# Patient Record
Sex: Male | Born: 1975 | Race: Black or African American | Hispanic: No | Smoking: Current every day smoker
Health system: Southern US, Community
[De-identification: ages and names within clinical notes are randomized; demographics above are authoritative.]

## PROBLEM LIST (undated history)

## (undated) DIAGNOSIS — I499 Cardiac arrhythmia, unspecified: Secondary | ICD-10-CM

## (undated) DIAGNOSIS — R0602 Shortness of breath: Secondary | ICD-10-CM

## (undated) DIAGNOSIS — Z21 Asymptomatic human immunodeficiency virus [HIV] infection status: Secondary | ICD-10-CM

## (undated) DIAGNOSIS — B20 Human immunodeficiency virus [HIV] disease: Secondary | ICD-10-CM

## (undated) DIAGNOSIS — I10 Essential (primary) hypertension: Secondary | ICD-10-CM

## (undated) DIAGNOSIS — K219 Gastro-esophageal reflux disease without esophagitis: Secondary | ICD-10-CM

## (undated) HISTORY — PX: HEMORRHOID SURGERY: SHX153

---

## 2001-11-20 ENCOUNTER — Emergency Department (HOSPITAL_COMMUNITY): Admission: EM | Admit: 2001-11-20 | Discharge: 2001-11-21 | Payer: Self-pay

## 2001-11-21 ENCOUNTER — Encounter: Payer: Self-pay | Admitting: Emergency Medicine

## 2002-03-09 ENCOUNTER — Encounter: Payer: Self-pay | Admitting: Emergency Medicine

## 2002-03-09 ENCOUNTER — Emergency Department (HOSPITAL_COMMUNITY): Admission: EM | Admit: 2002-03-09 | Discharge: 2002-03-09 | Payer: Self-pay | Admitting: Emergency Medicine

## 2002-03-10 ENCOUNTER — Emergency Department (HOSPITAL_COMMUNITY): Admission: EM | Admit: 2002-03-10 | Discharge: 2002-03-10 | Payer: Self-pay | Admitting: Emergency Medicine

## 2002-03-11 ENCOUNTER — Encounter: Payer: Self-pay | Admitting: Emergency Medicine

## 2002-03-11 ENCOUNTER — Emergency Department (HOSPITAL_COMMUNITY): Admission: EM | Admit: 2002-03-11 | Discharge: 2002-03-11 | Payer: Self-pay | Admitting: Emergency Medicine

## 2002-03-13 ENCOUNTER — Encounter: Admission: RE | Admit: 2002-03-13 | Discharge: 2002-03-13 | Payer: Self-pay | Admitting: Internal Medicine

## 2002-04-12 ENCOUNTER — Encounter (HOSPITAL_BASED_OUTPATIENT_CLINIC_OR_DEPARTMENT_OTHER): Admission: RE | Admit: 2002-04-12 | Discharge: 2002-07-11 | Payer: Self-pay | Admitting: Internal Medicine

## 2002-06-18 ENCOUNTER — Emergency Department (HOSPITAL_COMMUNITY): Admission: EM | Admit: 2002-06-18 | Discharge: 2002-06-19 | Payer: Self-pay | Admitting: Emergency Medicine

## 2002-06-19 ENCOUNTER — Encounter: Payer: Self-pay | Admitting: Emergency Medicine

## 2003-09-19 ENCOUNTER — Emergency Department (HOSPITAL_COMMUNITY): Admission: AD | Admit: 2003-09-19 | Discharge: 2003-09-19 | Payer: Self-pay | Admitting: Family Medicine

## 2007-05-06 ENCOUNTER — Emergency Department (HOSPITAL_COMMUNITY): Admission: EM | Admit: 2007-05-06 | Discharge: 2007-05-06 | Payer: Self-pay | Admitting: Emergency Medicine

## 2007-12-17 ENCOUNTER — Emergency Department (HOSPITAL_COMMUNITY): Admission: EM | Admit: 2007-12-17 | Discharge: 2007-12-17 | Payer: Self-pay | Admitting: Emergency Medicine

## 2008-09-23 ENCOUNTER — Emergency Department (HOSPITAL_COMMUNITY): Admission: EM | Admit: 2008-09-23 | Discharge: 2008-09-24 | Payer: Self-pay | Admitting: Emergency Medicine

## 2009-05-10 ENCOUNTER — Emergency Department (HOSPITAL_COMMUNITY): Admission: EM | Admit: 2009-05-10 | Discharge: 2009-05-10 | Payer: Self-pay | Admitting: Emergency Medicine

## 2009-08-31 ENCOUNTER — Emergency Department (HOSPITAL_COMMUNITY): Admission: EM | Admit: 2009-08-31 | Discharge: 2009-08-31 | Payer: Self-pay | Admitting: Family Medicine

## 2009-09-12 ENCOUNTER — Emergency Department (HOSPITAL_COMMUNITY): Admission: EM | Admit: 2009-09-12 | Discharge: 2009-09-12 | Payer: Self-pay | Admitting: Emergency Medicine

## 2009-11-08 ENCOUNTER — Emergency Department (HOSPITAL_COMMUNITY): Admission: EM | Admit: 2009-11-08 | Discharge: 2009-11-08 | Payer: Self-pay | Admitting: Emergency Medicine

## 2010-05-23 ENCOUNTER — Emergency Department (HOSPITAL_COMMUNITY): Admission: EM | Admit: 2010-05-23 | Discharge: 2010-05-23 | Payer: Self-pay | Admitting: Emergency Medicine

## 2010-12-01 ENCOUNTER — Emergency Department (HOSPITAL_COMMUNITY): Payer: Self-pay

## 2010-12-01 ENCOUNTER — Observation Stay (HOSPITAL_COMMUNITY)
Admission: EM | Admit: 2010-12-01 | Discharge: 2010-12-02 | Disposition: A | Discharge status: Home / Self Care | Payer: Self-pay | Attending: Family Medicine | Admitting: Family Medicine

## 2010-12-01 DIAGNOSIS — I10 Essential (primary) hypertension: Secondary | ICD-10-CM | POA: Insufficient documentation

## 2010-12-01 DIAGNOSIS — R0602 Shortness of breath: Secondary | ICD-10-CM | POA: Insufficient documentation

## 2010-12-01 DIAGNOSIS — R0789 Other chest pain: Secondary | ICD-10-CM

## 2010-12-01 DIAGNOSIS — Z21 Asymptomatic human immunodeficiency virus [HIV] infection status: Secondary | ICD-10-CM | POA: Insufficient documentation

## 2010-12-01 DIAGNOSIS — I498 Other specified cardiac arrhythmias: Secondary | ICD-10-CM | POA: Insufficient documentation

## 2010-12-01 DIAGNOSIS — F1410 Cocaine abuse, uncomplicated: Secondary | ICD-10-CM | POA: Insufficient documentation

## 2010-12-01 DIAGNOSIS — F1210 Cannabis abuse, uncomplicated: Secondary | ICD-10-CM | POA: Insufficient documentation

## 2010-12-01 DIAGNOSIS — Z79899 Other long term (current) drug therapy: Secondary | ICD-10-CM | POA: Insufficient documentation

## 2010-12-01 DIAGNOSIS — B20 Human immunodeficiency virus [HIV] disease: Secondary | ICD-10-CM

## 2010-12-01 DIAGNOSIS — R079 Chest pain, unspecified: Principal | ICD-10-CM | POA: Insufficient documentation

## 2010-12-01 LAB — POCT I-STAT, CHEM 8
BUN: 9 mg/dL (ref 6–23)
Creatinine, Ser: 1.2 mg/dL (ref 0.4–1.5)
Glucose, Bld: 92 mg/dL (ref 70–99)
Potassium: 3.4 mEq/L — ABNORMAL LOW (ref 3.5–5.1)
Sodium: 141 mEq/L (ref 135–145)
TCO2: 23 mmol/L (ref 0–100)

## 2010-12-01 LAB — RAPID URINE DRUG SCREEN, HOSP PERFORMED
Amphetamines: NOT DETECTED
Cocaine: POSITIVE — AB
Opiates: NOT DETECTED
Tetrahydrocannabinol: POSITIVE — AB

## 2010-12-01 LAB — POCT CARDIAC MARKERS

## 2010-12-02 LAB — CARDIAC PANEL(CRET KIN+CKTOT+MB+TROPI)
CK, MB: 1.5 ng/mL (ref 0.3–4.0)
CK, MB: 1 ng/mL (ref 0.3–4.0)
Relative Index: 0.7 (ref 0.0–2.5)
Relative Index: 0.6 (ref 0.0–2.5)
Total CK: 175 U/L (ref 7–232)
Troponin I: 0.01 ng/mL (ref 0.00–0.06)

## 2010-12-02 LAB — COMPREHENSIVE METABOLIC PANEL
ALT: 14 U/L (ref 0–53)
AST: 17 U/L (ref 0–37)
CO2: 29 mEq/L (ref 19–32)
Chloride: 104 mEq/L (ref 96–112)
GFR calc Af Amer: >60 mL/min (ref >60)
GFR calc non Af Amer: >60 mL/min (ref >60)
Potassium: 3.1 mEq/L — ABNORMAL LOW (ref 3.5–5.1)
Sodium: 140 mEq/L (ref 135–145)
Total Bilirubin: 0.3 mg/dL (ref 0.3–1.2)

## 2010-12-02 LAB — T-HELPER CELLS (CD4) COUNT (NOT AT ARMC): CD4 % Helper T Cell: 37 % (ref 33–55)

## 2010-12-02 LAB — LIPID PANEL: HDL: 41 mg/dL (ref >39)

## 2010-12-02 LAB — CBC
Hemoglobin: 13.8 g/dL (ref 13.0–17.0)
RBC: 4.41 MIL/uL (ref 4.22–5.81)

## 2010-12-02 LAB — TSH: TSH: 2.042 u[IU]/mL (ref 0.350–4.500)

## 2010-12-05 LAB — HIV 1/2 CONFIRMATION: HIV-1 antibody: POSITIVE

## 2010-12-15 NOTE — Discharge Summary (Signed)
NAME:  Jerry Scott, Jerry Scott               ACCOUNT NO.:  [id number]  MEDICAL RECORD NO.:  [id number]           PATIENT TYPE:  E  LOCATION:  [facility]                         FACILITY:  [facility]  PHYSICIAN:  Leighton Roach Rue Tinnel, M.D.DATE OF BIRTH:  19-Sep-1975  DATE OF ADMISSION:  12/01/2010 DATE OF DISCHARGE:  12/02/2010                              DISCHARGE SUMMARY   DISCHARGE DIAGNOSES: 1. Chest pain in the setting of supraventricular tachycardia,     resolved. 2. Hypertension. 3. Human immunodeficiency virus.  DISCHARGE MEDICATIONS:  New medications at discharge: 1. Tylenol 650 mg p.o. q.6 h. as needed for pain. 2. Hydrochlorothiazide 25 mg p.o. daily. 3. Atripla 1 tablet p.o. daily.  PERTINENT LABS AT DISCHARGE:  Cardiac panel negative x3 sets.  CD-4 count 1120, helper T cell percent 37, A1c 5.2, TSH 2.042, cholesterol 171, HDL 41, LDL 106.  PERTINENT STUDIES AT DISCHARGE:  With EKG, normal sinus rhythm to the rate.  Heart rate is 62.  HOSPITAL COURSE:  The patient is a 35 year old male who presented with the chief complaint of chest pain, with supraventricular tachycardia with the heart rate of 167, QRS duration of 98. 1. Chest pain.  The patient is an SVT spontaneously converted to sinus     rhythm.  While in the ED, he was admitted for chest pain rule out.     The patient chest pain resolved overnight.     Cardiac enzymes were cycled and were negative as mentioned above.     A repeat EKG obtained in a.m. is as mentioned above with normal     sinus rhythm at the rate of 62.  The etiology of the patient's chest     pain was determined most likely to be secondary to his cocaine     abuse and SVT.  Other labs for risk stratification were as     mentioned above. The patient was counseled to refrain from cocaine     abuse in order to prevent repeat chest pain. 2. Hypertension.  The patient has a known history of hypertension with     diastolic  blood pressures in the 90s on previous ED  visit.  He takes     hydrochlorothiazide but has been out for the past 2 months.     Hydrochlorothiazide was restarted, on this admission.  The patient     was given prescription for a 54-month supply. 3. History of HIV.  The patient continued on his home dose of Atripla.     The viral load was as mentioned above.  The patient has followed up     with his Infectious Disease physician at [facility], did miss his     last 2 appointments.  He is advised to schedule an appointment at     discharge.  DISCHARGE INSTRUCTIONS:  The patient was discharged to home with instruction with no limitations on diet or activity.  He was instructed to follow up with his primary MD at [facility] in next month for hospital followup.  He was also instructed to refrain from cocaine use.    ______________________________ Dessa Phi, MD  ______________________________ Etta Grandchild, M.D.    JF/MEDQ  D:  12/03/2010  T:  12/04/2010  Job:  [id number]  Electronically Signed by Dessa Phi MD on 12/13/2010 03:35:31 PM Electronically Signed by Acquanetta Belling M.D. on 12/15/2010 09:37:36 AM

## 2010-12-15 NOTE — H&P (Signed)
NAME:  Jerry Scott, Jerry Scott NO.:  [id number]  MEDICAL RECORD NO.:  [id number]           PATIENT TYPE:  E  LOCATION:  [facility]                         FACILITY:  [facility]  PHYSICIAN:  Jerry Scott, M.D.DATE OF BIRTH:  1975/11/04  DATE OF ADMISSION:  12/01/2010 DATE OF DISCHARGE:                             HISTORY & PHYSICAL   PRIMARY CARE PHYSICIAN:  Unassigned followed by Infectious Disease at [facility].  CHIEF COMPLAINT:  Palpitations and chest pain.  HISTORY OF PRESENT ILLNESS:  Jerry Scott is a 35 year old male with past medical history significant for hypertension, HIV and cocaine and cannabis abuse who presents with 1 day history of palpitations and chest pain.  The patient states he is using cocaine last night and was started sneezing.  Chest pain started after sneezing.  He smokes marijuana and start experiencing a "fluttering in his chest."  This lasted for about an hour and then resolved.  He works today at [employer] job, but did not experience any further symptoms.  When he got home from works, he smokes at this time and experienced the same symptoms.  With this time, also complaint of shortness of breath and some chest pain.  He described the chest pain was sternal, sharp pain and dull aching pain.  He came to the emergency department tonight because of these symptoms.  Of note, he denies any headaches, abdominal pain, nausea, vomiting, change in bowel habits, or any lower extremity edema.  In the emergency department, the chest pain and shortness of breath which he experienced earlier today have now resolved.  PAST MEDICAL HISTORY: 1. Hypertension. 2. HIV.  MEDICATIONS:  Atripla.  ALLERGIES:  No known drug allergies.  FAMILY HISTORY:  Hypertension, diabetes mellitus type 2.  Otherwise he denies.  SOCIAL HISTORY:  The patient lives at home here in [place] with his friend.  He denies any tobacco use.  Does endorse an occasional  ethanol marijuana, and cocaine use.  REVIEW OF SYSTEMS:  Twelve-point review of systems is clearly negative except for HPI.  PHYSICAL EXAMINATION:  VITAL SIGNS:  Temperature 98, pulse 88, respiratory rate 18, blood pressure was 122/88, O2 sats 98% on room air. GENERAL:  The patient is lying in bed and comfortable appearing. HEENT:  Head:  Normocephalic and atraumatic.  Normal conjunctivae. Extraocular movements intact.  Pupils are equal, round, and reactive to light.  Mucous membranes moist. NECK:  No JVD.  No thyromegaly. CV:  Regular rate and rhythm without murmurs, rubs, or gallops. LUNGS:  Respirations clear to auscultation bilaterally.  No crackle or coarse breath sounds throughout. ABDOMEN:  Soft, nondistended, and nontender.  Bowel sounds x4 quadrants. EXTREMITIES:  No erythema, clubbing or cyanosis.  No edema.  Pulses; 2+ DP pulses bilaterally with brisk capillary refills in toes bilaterally. NEUROLOGIC:  Cranial nerves II through XII intact.  No sensorimotor deficits in bilateral upper or lower extremities.  LABORATORY DATA: 1. Hemoglobin 15, hematocrit 44. 2. BMET showed sodium 141, potassium 3.4, chloride 92, bicarb 23, BUN     9, creatinine 1.2. 3. UDS positive for THC and cocaine. 4. Point-of-care cardiac enzymes are  negative.  IMAGING:  Chest x-ray showed no acute cardiopulmonary disease.  ASSESSMENT AND PLAN:  A 35 year old male with past medical history significant for hypertension and HIV who was admitted for palpitations and chest pain with rule out acute coronary syndrome. 1. Palpitations.  Tele here in the emergency department did show     supraventricular tachycardia to 180s, but this was spontaneously     resolved without medication.  The patient returned from chest x-ray     before medications could be given.  In this patient with recent cocaine abuse,     there was concern for possible acute coronary syndrome.  We will     cycle cardiac enzymes.  Chest  x-ray in the ED showed no lung     disease or fluid overload.  We will obtain stat EKG and obtain     another EKG in the morning to clear.  No EKG was performed in the     emergency department.  Also, obtain TSH to rule out thyroid     disease.  Obtain a fasting lipid panel with A1c addressed by the     patient, although his cocaine already puts him in the high-risk     category.  We admits to telemetry, follow closely. 2. Hypertension, he is on his medications currently.  We will watch     his blood pressures while in-house.  We will hold his beta-blockers     and the patient with recent cocaine use.  Will use hydralazine     p.r.n. if  needed. 3. Human immunodeficiency virus.  The patient states that he usually     follows up with [facility], but he has missed several     appointments including his most recent one.  CBC does not show any     evidence of  immediate conversation with normal white blood cell     count.  Plan to obtain a viral load and C4 count.  We will continue     the patient on Atripla. 4. GI.  Heart-healthy diet.  Check electrolytes in a.m. 5. Prophylaxis.  Heparin 5000 units subsequently. 6. Disposition.  Pending clinical improvement.     Jerry Don, MD   ______________________________ Jerry Scott, M.D.    JW/MEDQ  D:  12/02/2010  T:  12/02/2010  Job:  [id number]  Electronically Signed by Jerry Scott  on 12/09/2010 01:54:36 PM Electronically Signed by Jerry Scott M.D. on 12/15/2010 09:37:30 AM

## 2011-02-16 ENCOUNTER — Inpatient Hospital Stay (INDEPENDENT_AMBULATORY_CARE_PROVIDER_SITE_OTHER)
Admission: RE | Admit: 2011-02-16 | Discharge: 2011-02-16 | Disposition: A | Discharge status: Home / Self Care | Payer: Self-pay | Source: Ambulatory Visit | Attending: Emergency Medicine | Admitting: Emergency Medicine

## 2011-02-16 DIAGNOSIS — J019 Acute sinusitis, unspecified: Secondary | ICD-10-CM

## 2011-02-16 DIAGNOSIS — G510 Bell's palsy: Secondary | ICD-10-CM

## 2011-06-12 LAB — COMPREHENSIVE METABOLIC PANEL
Albumin: 4
BUN: 5 — ABNORMAL LOW
Calcium: 9.5
Creatinine, Ser: 0.9
Potassium: 3.5
Total Protein: 7.1

## 2011-06-12 LAB — CBC
HCT: 43.5
MCHC: 34.9
MCV: 92.8
Platelets: 243
RDW: 12.5

## 2011-06-12 LAB — DIFFERENTIAL
Lymphocytes Relative: 35
Lymphs Abs: 1.9
Monocytes Absolute: 0.5
Monocytes Relative: 9
Neutro Abs: 2.8

## 2011-06-17 ENCOUNTER — Other Ambulatory Visit: Payer: Self-pay | Admitting: Specialist

## 2011-06-17 ENCOUNTER — Other Ambulatory Visit: Payer: Self-pay

## 2011-06-17 DIAGNOSIS — M549 Dorsalgia, unspecified: Secondary | ICD-10-CM

## 2011-06-18 ENCOUNTER — Other Ambulatory Visit (HOSPITAL_COMMUNITY): Payer: Self-pay | Admitting: Specialist

## 2011-06-18 DIAGNOSIS — M549 Dorsalgia, unspecified: Secondary | ICD-10-CM

## 2011-06-19 ENCOUNTER — Ambulatory Visit (HOSPITAL_COMMUNITY)
Admission: RE | Admit: 2011-06-19 | Discharge: 2011-06-19 | Disposition: A | Discharge status: Home / Self Care | Payer: Self-pay | Source: Ambulatory Visit | Attending: Specialist | Admitting: Specialist

## 2011-06-19 DIAGNOSIS — M51379 Other intervertebral disc degeneration, lumbosacral region without mention of lumbar back pain or lower extremity pain: Secondary | ICD-10-CM | POA: Insufficient documentation

## 2011-06-19 DIAGNOSIS — M549 Dorsalgia, unspecified: Secondary | ICD-10-CM

## 2011-06-19 DIAGNOSIS — M5126 Other intervertebral disc displacement, lumbar region: Secondary | ICD-10-CM | POA: Insufficient documentation

## 2011-06-19 DIAGNOSIS — M4120 Other idiopathic scoliosis, site unspecified: Secondary | ICD-10-CM | POA: Insufficient documentation

## 2011-06-19 DIAGNOSIS — M5137 Other intervertebral disc degeneration, lumbosacral region: Secondary | ICD-10-CM | POA: Insufficient documentation

## 2011-06-25 ENCOUNTER — Other Ambulatory Visit: Payer: Self-pay | Admitting: Specialist

## 2011-06-25 DIAGNOSIS — M549 Dorsalgia, unspecified: Secondary | ICD-10-CM

## 2011-06-29 ENCOUNTER — Inpatient Hospital Stay: Admission: RE | Admit: 2011-06-29 | Payer: Self-pay | Source: Ambulatory Visit

## 2011-07-01 ENCOUNTER — Inpatient Hospital Stay
Admission: RE | Admit: 2011-07-01 | Discharge: 2011-07-01 | Payer: Self-pay | Source: Ambulatory Visit | Attending: Specialist | Admitting: Specialist

## 2011-07-01 DIAGNOSIS — M5126 Other intervertebral disc displacement, lumbar region: Secondary | ICD-10-CM

## 2011-07-16 ENCOUNTER — Inpatient Hospital Stay: Admission: RE | Admit: 2011-07-16 | Payer: Self-pay | Source: Ambulatory Visit

## 2011-08-20 ENCOUNTER — Other Ambulatory Visit (HOSPITAL_COMMUNITY): Payer: Self-pay | Admitting: Specialist

## 2011-08-27 ENCOUNTER — Encounter (HOSPITAL_COMMUNITY): Payer: Self-pay | Admitting: Pharmacy Technician

## 2011-08-31 ENCOUNTER — Encounter (HOSPITAL_COMMUNITY)
Admission: RE | Admit: 2011-08-31 | Discharge: 2011-08-31 | Disposition: A | Discharge status: Home / Self Care | Payer: PRIVATE HEALTH INSURANCE | Source: Ambulatory Visit | Attending: Specialist | Admitting: Specialist

## 2011-08-31 ENCOUNTER — Encounter (HOSPITAL_COMMUNITY): Payer: Self-pay

## 2011-08-31 ENCOUNTER — Other Ambulatory Visit: Payer: Self-pay

## 2011-08-31 HISTORY — DX: Gastro-esophageal reflux disease without esophagitis: K21.9

## 2011-08-31 HISTORY — DX: Shortness of breath: R06.02

## 2011-08-31 HISTORY — DX: Cardiac arrhythmia, unspecified: I49.9

## 2011-08-31 HISTORY — DX: Essential (primary) hypertension: I10

## 2011-08-31 HISTORY — DX: Human immunodeficiency virus (HIV) disease: B20

## 2011-08-31 HISTORY — DX: Asymptomatic human immunodeficiency virus (hiv) infection status: Z21

## 2011-08-31 LAB — CBC
Hemoglobin: 14.8 g/dL (ref 13.0–17.0)
MCH: 32.7 pg (ref 26.0–34.0)
MCHC: 35.6 g/dL (ref 30.0–36.0)
MCV: 91.8 fL (ref 78.0–100.0)
Platelets: 179 10*3/uL (ref 150–400)
RBC: 4.53 MIL/uL (ref 4.22–5.81)

## 2011-08-31 LAB — SURGICAL PCR SCREEN: MRSA, PCR: NEGATIVE

## 2011-08-31 LAB — BASIC METABOLIC PANEL
BUN: 10 mg/dL (ref 6–23)
CO2: 25 mEq/L (ref 19–32)
Calcium: 9.5 mg/dL (ref 8.4–10.5)
GFR calc non Af Amer: >90 mL/min (ref >90)
Glucose, Bld: 100 mg/dL — ABNORMAL HIGH (ref 70–99)
Sodium: 142 mEq/L (ref 135–145)

## 2011-08-31 NOTE — Progress Notes (Signed)
Echo requested from [facility].

## 2011-08-31 NOTE — Progress Notes (Signed)
Forwarded EKG and echo to anesthesia for review.

## 2011-08-31 NOTE — Pre-Procedure Instructions (Signed)
20 Jerry Scott  08/31/2011   Your procedure is scheduled on:  Friday September 04, 2011*  Report to [facility] at 1030 AM.  Call this number if you have problems the morning of surgery: [phone]   Remember:   Do not eat food:After Midnight.  May have clear liquids: up to 4 Hours before arrival. (up to 6:30am)  Clear liquids include soda, tea, black coffee, apple or grape juice, broth.  Take these medicines the morning of surgery with A SIP OF WATER: Atripla, hydrocodone   Do not wear jewelry, make-up or nail polish.  Do not wear lotions, powders, or perfumes. You may wear deodorant.  Do not shave 48 hours prior to surgery.  Do not bring valuables to the hospital.  Contacts, dentures or bridgework may not be worn into surgery.  Leave suitcase in the car. After surgery it may be brought to your room.  For patients admitted to the hospital, checkout time is 11:00 AM the day of discharge.   Patients discharged the day of surgery will not be allowed to drive home.  Name and phone number of your driver: Jerry Scott [phone]  Special Instructions: CHG Shower Use Special Wash: 1/2 bottle night before surgery and 1/2 bottle morning of surgery.   Please read over the following fact sheets that you were given: Pain Booklet, Coughing and Deep Breathing, MRSA Information and Surgical Site Infection Prevention

## 2011-09-02 NOTE — Consult Note (Signed)
Anesthesia:  Patient is a 36 year old male scheduled for left L5-S1 microdiskectomy on 09/04/11.  His history includes HIV, GERD, HTN, and smoking.    His EKG shows a low R or Q wave in III, but otherwise appears normal.  The interpreting Cardiologist also felt EKG was NSR.  He had an echo done at [facility] on 09/12/10 that showed normal LV function, no significant valvular abnormalities, and EF of 60%.  CXR from 12/01/10 showed no acute process.  Preoperative labs also unremarkable.  Plan to proceed.

## 2011-09-03 MED ORDER — CEFAZOLIN SODIUM-DEXTROSE 2-3 GM-% IV SOLR
2.0000 g | INTRAVENOUS | Status: AC
  Administered 2011-09-04: 2 g via INTRAVENOUS
  Filled 2011-09-03: qty 50

## 2011-09-04 ENCOUNTER — Encounter (HOSPITAL_COMMUNITY): Payer: Self-pay | Admitting: Vascular Surgery

## 2011-09-04 ENCOUNTER — Ambulatory Visit (HOSPITAL_COMMUNITY): Payer: PRIVATE HEALTH INSURANCE

## 2011-09-04 ENCOUNTER — Encounter (HOSPITAL_COMMUNITY)
Admission: RE | Disposition: A | Discharge status: Home / Self Care | Payer: Self-pay | Source: Ambulatory Visit | Attending: Specialist

## 2011-09-04 ENCOUNTER — Encounter (HOSPITAL_COMMUNITY): Payer: Self-pay | Admitting: *Deleted

## 2011-09-04 ENCOUNTER — Encounter (HOSPITAL_COMMUNITY): Payer: Self-pay | Admitting: Specialist

## 2011-09-04 ENCOUNTER — Ambulatory Visit (HOSPITAL_COMMUNITY): Payer: PRIVATE HEALTH INSURANCE | Admitting: Vascular Surgery

## 2011-09-04 ENCOUNTER — Ambulatory Visit (HOSPITAL_COMMUNITY)
Admission: RE | Admit: 2011-09-04 | Discharge: 2011-09-05 | Disposition: A | Discharge status: Home / Self Care | Payer: PRIVATE HEALTH INSURANCE | Source: Ambulatory Visit | Attending: Specialist | Admitting: Specialist

## 2011-09-04 DIAGNOSIS — K219 Gastro-esophageal reflux disease without esophagitis: Secondary | ICD-10-CM | POA: Insufficient documentation

## 2011-09-04 DIAGNOSIS — F17200 Nicotine dependence, unspecified, uncomplicated: Secondary | ICD-10-CM | POA: Insufficient documentation

## 2011-09-04 DIAGNOSIS — I499 Cardiac arrhythmia, unspecified: Secondary | ICD-10-CM | POA: Insufficient documentation

## 2011-09-04 DIAGNOSIS — R0602 Shortness of breath: Secondary | ICD-10-CM | POA: Insufficient documentation

## 2011-09-04 DIAGNOSIS — M5126 Other intervertebral disc displacement, lumbar region: Secondary | ICD-10-CM | POA: Diagnosis present

## 2011-09-04 DIAGNOSIS — F411 Generalized anxiety disorder: Secondary | ICD-10-CM | POA: Insufficient documentation

## 2011-09-04 DIAGNOSIS — M48062 Spinal stenosis, lumbar region with neurogenic claudication: Secondary | ICD-10-CM | POA: Diagnosis present

## 2011-09-04 DIAGNOSIS — F329 Major depressive disorder, single episode, unspecified: Secondary | ICD-10-CM | POA: Insufficient documentation

## 2011-09-04 DIAGNOSIS — I10 Essential (primary) hypertension: Secondary | ICD-10-CM | POA: Insufficient documentation

## 2011-09-04 DIAGNOSIS — F3289 Other specified depressive episodes: Secondary | ICD-10-CM | POA: Insufficient documentation

## 2011-09-04 DIAGNOSIS — Z21 Asymptomatic human immunodeficiency virus [HIV] infection status: Secondary | ICD-10-CM | POA: Insufficient documentation

## 2011-09-04 HISTORY — PX: LUMBAR LAMINECTOMY: SHX95

## 2011-09-04 SURGERY — MICRODISCECTOMY LUMBAR LAMINECTOMY
Anesthesia: General

## 2011-09-04 MED ORDER — HYDROMORPHONE HCL PF 1 MG/ML IJ SOLN
0.2500 mg | INTRAMUSCULAR | Status: DC | PRN
  Administered 2011-09-04 (×2): 0.5 mg via INTRAVENOUS

## 2011-09-04 MED ORDER — OXYCODONE-ACETAMINOPHEN 5-325 MG PO TABS
1.0000 | ORAL_TABLET | ORAL | Status: DC | PRN
  Administered 2011-09-05 (×2): 2 via ORAL
  Filled 2011-09-04 (×2): qty 2

## 2011-09-04 MED ORDER — BUPIVACAINE-EPINEPHRINE 0.5% -1:200000 IJ SOLN
INTRAMUSCULAR | Status: DC | PRN
  Administered 2011-09-04: 20 mL

## 2011-09-04 MED ORDER — KETOROLAC TROMETHAMINE 30 MG/ML IJ SOLN
30.0000 mg | Freq: Four times a day (QID) | INTRAMUSCULAR | Status: DC
  Administered 2011-09-04 – 2011-09-05 (×2): 30 mg via INTRAVENOUS
  Filled 2011-09-04 (×6): qty 1

## 2011-09-04 MED ORDER — MEPERIDINE HCL 25 MG/ML IJ SOLN: 6.2500 mg | INTRAMUSCULAR | Status: DC | PRN

## 2011-09-04 MED ORDER — MIDAZOLAM HCL 2 MG/2ML IJ SOLN: 0.5000 mg | Freq: Once | INTRAMUSCULAR | Status: DC | PRN

## 2011-09-04 MED ORDER — EFAVIRENZ-EMTRICITAB-TENOFOVIR 600-200-300 MG PO TABS
1.0000 | ORAL_TABLET | Freq: Every day | ORAL | Status: DC
  Administered 2011-09-04: 1 via ORAL
  Filled 2011-09-04 (×2): qty 1

## 2011-09-04 MED ORDER — HYDROMORPHONE HCL PF 1 MG/ML IJ SOLN
0.5000 mg | INTRAMUSCULAR | Status: DC | PRN
  Administered 2011-09-04 – 2011-09-05 (×2): 1 mg via INTRAVENOUS
  Filled 2011-09-04 (×3): qty 1

## 2011-09-04 MED ORDER — LACTATED RINGERS IV SOLN
INTRAVENOUS | Status: DC
  Administered 2011-09-04: 12:00:00 via INTRAVENOUS

## 2011-09-04 MED ORDER — PROMETHAZINE HCL 25 MG/ML IJ SOLN: 6.2500 mg | INTRAMUSCULAR | Status: DC | PRN

## 2011-09-04 MED ORDER — BISACODYL 10 MG RE SUPP: 10.0000 mg | Freq: Every day | RECTAL | Status: DC | PRN

## 2011-09-04 MED ORDER — VECURONIUM BROMIDE 10 MG IV SOLR
INTRAVENOUS | Status: DC | PRN
  Administered 2011-09-04: 10 mg via INTRAVENOUS

## 2011-09-04 MED ORDER — PNEUMOCOCCAL VAC POLYVALENT 25 MCG/0.5ML IJ INJ
0.5000 mL | INJECTION | INTRAMUSCULAR | Status: DC
  Filled 2011-09-04: qty 0.5

## 2011-09-04 MED ORDER — ONDANSETRON HCL 4 MG/2ML IJ SOLN
4.0000 mg | INTRAMUSCULAR | Status: DC | PRN
  Administered 2011-09-04 – 2011-09-05 (×2): 4 mg via INTRAVENOUS
  Filled 2011-09-04 (×2): qty 2

## 2011-09-04 MED ORDER — METHOCARBAMOL 500 MG PO TABS
500.0000 mg | ORAL_TABLET | Freq: Four times a day (QID) | ORAL | Status: DC | PRN
  Administered 2011-09-04 – 2011-09-05 (×2): 500 mg via ORAL
  Filled 2011-09-04 (×2): qty 1

## 2011-09-04 MED ORDER — LACTATED RINGERS IV SOLN
INTRAVENOUS | Status: DC | PRN
  Administered 2011-09-04 (×2): via INTRAVENOUS

## 2011-09-04 MED ORDER — ZOLPIDEM TARTRATE 10 MG PO TABS: 10.0000 mg | ORAL_TABLET | Freq: Every evening | ORAL | Status: DC | PRN

## 2011-09-04 MED ORDER — PHENOL 1.4 % MT LIQD
1.0000 | OROMUCOSAL | Status: DC | PRN
  Filled 2011-09-04: qty 177

## 2011-09-04 MED ORDER — LACTATED RINGERS IV SOLN: INTRAVENOUS | Status: DC

## 2011-09-04 MED ORDER — HYDROCHLOROTHIAZIDE 25 MG PO TABS
25.0000 mg | ORAL_TABLET | Freq: Every day | ORAL | Status: DC
  Administered 2011-09-04: 25 mg via ORAL
  Filled 2011-09-04 (×2): qty 1

## 2011-09-04 MED ORDER — CEFAZOLIN SODIUM 1-5 GM-% IV SOLN
1.0000 g | Freq: Three times a day (TID) | INTRAVENOUS | Status: AC
  Administered 2011-09-04 – 2011-09-05 (×2): 1 g via INTRAVENOUS
  Filled 2011-09-04 (×2): qty 50

## 2011-09-04 MED ORDER — ALUM & MAG HYDROXIDE-SIMETH 200-200-20 MG/5ML PO SUSP: 30.0000 mL | Freq: Four times a day (QID) | ORAL | Status: DC | PRN

## 2011-09-04 MED ORDER — KCL IN DEXTROSE-NACL 20-5-0.45 MEQ/L-%-% IV SOLN
INTRAVENOUS | Status: AC
  Filled 2011-09-04: qty 1000

## 2011-09-04 MED ORDER — NEOSTIGMINE METHYLSULFATE 1 MG/ML IJ SOLN
INTRAMUSCULAR | Status: DC | PRN
  Administered 2011-09-04: .84 mg via INTRAVENOUS

## 2011-09-04 MED ORDER — INFLUENZA VIRUS VACC SPLIT PF IM SUSP
0.5000 mL | INTRAMUSCULAR | Status: DC
  Filled 2011-09-04: qty 0.5

## 2011-09-04 MED ORDER — ONDANSETRON HCL 4 MG/2ML IJ SOLN
INTRAMUSCULAR | Status: DC | PRN
  Administered 2011-09-04: 4 mg via INTRAVENOUS

## 2011-09-04 MED ORDER — HYDROCODONE-ACETAMINOPHEN 5-325 MG PO TABS: 1.0000 | ORAL_TABLET | ORAL | Status: DC | PRN

## 2011-09-04 MED ORDER — MENTHOL 3 MG MT LOZG: 1.0000 | LOZENGE | OROMUCOSAL | Status: DC | PRN

## 2011-09-04 MED ORDER — FENTANYL CITRATE 0.05 MG/ML IJ SOLN
INTRAMUSCULAR | Status: DC | PRN
  Administered 2011-09-04 (×2): 50 ug via INTRAVENOUS
  Administered 2011-09-04: 150 ug via INTRAVENOUS
  Administered 2011-09-04: 50 ug via INTRAVENOUS

## 2011-09-04 MED ORDER — MORPHINE SULFATE 2 MG/ML IJ SOLN: 0.0500 mg/kg | INTRAMUSCULAR | Status: DC | PRN

## 2011-09-04 MED ORDER — ACETAMINOPHEN 650 MG RE SUPP: 650.0000 mg | RECTAL | Status: DC | PRN

## 2011-09-04 MED ORDER — KCL IN DEXTROSE-NACL 20-5-0.45 MEQ/L-%-% IV SOLN
INTRAVENOUS | Status: DC
  Administered 2011-09-04 (×2): via INTRAVENOUS
  Filled 2011-09-04 (×3): qty 1000

## 2011-09-04 MED ORDER — PROPOFOL 10 MG/ML IV EMUL
INTRAVENOUS | Status: DC | PRN
  Administered 2011-09-04: 20 mg via INTRAVENOUS

## 2011-09-04 MED ORDER — CHLORHEXIDINE GLUCONATE 4 % EX LIQD: 60.0000 mL | Freq: Once | CUTANEOUS | Status: DC

## 2011-09-04 MED ORDER — PANTOPRAZOLE SODIUM 40 MG IV SOLR
40.0000 mg | Freq: Every day | INTRAVENOUS | Status: DC
  Administered 2011-09-04: 40 mg via INTRAVENOUS
  Filled 2011-09-04 (×2): qty 40

## 2011-09-04 MED ORDER — SENNOSIDES-DOCUSATE SODIUM 8.6-50 MG PO TABS: 1.0000 | ORAL_TABLET | Freq: Every evening | ORAL | Status: DC | PRN

## 2011-09-04 MED ORDER — THROMBIN 20000 UNITS EX KIT
PACK | CUTANEOUS | Status: DC | PRN
  Administered 2011-09-04: 15:00:00 via TOPICAL

## 2011-09-04 MED ORDER — METHOCARBAMOL 100 MG/ML IJ SOLN
500.0000 mg | Freq: Four times a day (QID) | INTRAVENOUS | Status: DC | PRN
  Filled 2011-09-04: qty 5

## 2011-09-04 MED ORDER — SODIUM CHLORIDE 0.9 % IJ SOLN: 3.0000 mL | INTRAMUSCULAR | Status: DC | PRN

## 2011-09-04 MED ORDER — ACETAMINOPHEN 325 MG PO TABS: 650.0000 mg | ORAL_TABLET | ORAL | Status: DC | PRN

## 2011-09-04 MED ORDER — DOCUSATE SODIUM 100 MG PO CAPS
100.0000 mg | ORAL_CAPSULE | Freq: Two times a day (BID) | ORAL | Status: DC
  Administered 2011-09-04: 100 mg via ORAL
  Filled 2011-09-04 (×3): qty 1

## 2011-09-04 MED ORDER — MIDAZOLAM HCL 5 MG/5ML IJ SOLN
INTRAMUSCULAR | Status: DC | PRN
  Administered 2011-09-04: 2 mg via INTRAVENOUS

## 2011-09-04 SURGICAL SUPPLY — 57 items
BUR RND FLUTED 2.5 (BURR) IMPLANT
BUR ROUND FLUTED 4 SOFT TCH (BURR) IMPLANT
BUR SABER RD CUTTING 3.0 (BURR) ×2 IMPLANT
CANISTER SUCTION 2500CC (MISCELLANEOUS) ×2 IMPLANT
CLOTH BEACON ORANGE TIMEOUT ST (SAFETY) ×2 IMPLANT
CORDS BIPOLAR (ELECTRODE) ×2 IMPLANT
COVER SURGICAL LIGHT HANDLE (MISCELLANEOUS) ×2 IMPLANT
DERMABOND ADHESIVE PROPEN (GAUZE/BANDAGES/DRESSINGS) ×1
DERMABOND ADVANCED (GAUZE/BANDAGES/DRESSINGS) ×1
DERMABOND ADVANCED .7 DNX12 (GAUZE/BANDAGES/DRESSINGS) ×1 IMPLANT
DERMABOND ADVANCED .7 DNX6 (GAUZE/BANDAGES/DRESSINGS) ×1 IMPLANT
DRAPE C-ARM 42X72 X-RAY (DRAPES) ×2 IMPLANT
DRAPE MICROSCOPE LEICA (MISCELLANEOUS) ×2 IMPLANT
DRAPE POUCH INSTRU U-SHP 10X18 (DRAPES) ×2 IMPLANT
DRAPE PROXIMA HALF (DRAPES) IMPLANT
DRAPE SURG 17X23 STRL (DRAPES) ×8 IMPLANT
DRSG MEPILEX BORDER 4X4 (GAUZE/BANDAGES/DRESSINGS) ×2 IMPLANT
DRSG MEPILEX BORDER 4X8 (GAUZE/BANDAGES/DRESSINGS) IMPLANT
DURAPREP 26ML APPLICATOR (WOUND CARE) ×2 IMPLANT
ELECT BLADE 4.0 EZ CLEAN MEGAD (MISCELLANEOUS) ×2
ELECT CAUTERY BLADE 6.4 (BLADE) ×2 IMPLANT
ELECT REM PT RETURN 9FT ADLT (ELECTROSURGICAL) ×2
ELECTRODE BLDE 4.0 EZ CLN MEGD (MISCELLANEOUS) ×1 IMPLANT
ELECTRODE REM PT RTRN 9FT ADLT (ELECTROSURGICAL) ×1 IMPLANT
GLOVE BIOGEL PI IND STRL 7.5 (GLOVE) ×1 IMPLANT
GLOVE BIOGEL PI INDICATOR 7.5 (GLOVE) ×1
GLOVE ECLIPSE 7.0 STRL STRAW (GLOVE) ×2 IMPLANT
GLOVE ECLIPSE 8.5 STRL (GLOVE) ×2 IMPLANT
GLOVE SURG 8.5 LATEX PF (GLOVE) ×2 IMPLANT
GOWN PREVENTION PLUS LG XLONG (DISPOSABLE) ×2 IMPLANT
GOWN STRL NON-REIN LRG LVL3 (GOWN DISPOSABLE) ×4 IMPLANT
GOWN STRL REIN 2XL LVL4 (GOWN DISPOSABLE) ×2 IMPLANT
KIT BASIN OR (CUSTOM PROCEDURE TRAY) ×2 IMPLANT
KIT ROOM TURNOVER OR (KITS) ×2 IMPLANT
NEEDLE 22X1 1/2 (OR ONLY) (NEEDLE) ×2 IMPLANT
NEEDLE SPNL 18GX3.5 QUINCKE PK (NEEDLE) ×4 IMPLANT
NS IRRIG 1000ML POUR BTL (IV SOLUTION) ×2 IMPLANT
PACK LAMINECTOMY ORTHO (CUSTOM PROCEDURE TRAY) ×2 IMPLANT
PAD ARMBOARD 7.5X6 YLW CONV (MISCELLANEOUS) ×4 IMPLANT
PATTIES SURGICAL .5 X.5 (GAUZE/BANDAGES/DRESSINGS) IMPLANT
PATTIES SURGICAL .75X.75 (GAUZE/BANDAGES/DRESSINGS) IMPLANT
SPONGE LAP 4X18 X RAY DECT (DISPOSABLE) IMPLANT
SPONGE SURGIFOAM ABS GEL 100 (HEMOSTASIS) ×2 IMPLANT
SUT VIC AB 0 CT1 27 (SUTURE) ×1
SUT VIC AB 0 CT1 27XBRD ANBCTR (SUTURE) ×1 IMPLANT
SUT VIC AB 1 CT1 27 (SUTURE)
SUT VIC AB 1 CT1 27XBRD ANBCTR (SUTURE) IMPLANT
SUT VIC AB 2-0 CT1 27 (SUTURE)
SUT VIC AB 2-0 CT1 TAPERPNT 27 (SUTURE) IMPLANT
SUT VIC AB 4-0 PS2 27 (SUTURE) ×2 IMPLANT
SUT VICRYL 0 UR6 27IN ABS (SUTURE) IMPLANT
SUT VICRYL 4-0 PS2 18IN ABS (SUTURE) IMPLANT
SYR CONTROL 10ML LL (SYRINGE) ×2 IMPLANT
TOWEL OR 17X24 6PK STRL BLUE (TOWEL DISPOSABLE) ×2 IMPLANT
TOWEL OR 17X26 10 PK STRL BLUE (TOWEL DISPOSABLE) ×2 IMPLANT
TRAY FOLEY CATH 14FR (SET/KITS/TRAYS/PACK) IMPLANT
WATER STERILE IRR 1000ML POUR (IV SOLUTION) IMPLANT

## 2011-09-04 NOTE — H&P (Signed)
The patient has been re-examined, and the chart reviewed, and there have been no interval changes to the documented history and physical.    The risks, benefits, and alternatives have been discussed at length, and the patient is willing to proceed.   

## 2011-09-04 NOTE — Anesthesia Postprocedure Evaluation (Signed)
  Anesthesia Post-op Note  Patient: Jerry Scott  Procedure(s) Performed:  MICRODISCECTOMY LUMBAR LAMINECTOMY - Left L5-S1 Microdiscectomy with MIS Equipment  Patient Location: PACU  Anesthesia Type: General  Level of Consciousness: awake  Airway and Oxygen Therapy: Patient Spontanous Breathing  Post-op Pain: mild  Post-op Assessment: Post-op Vital signs reviewed  Post-op Vital Signs: stable  Complications: No apparent anesthesia complications

## 2011-09-04 NOTE — Transfer of Care (Signed)
Immediate Anesthesia Transfer of Care Note  Patient: Jerry Scott  Procedure(s) Performed:  MICRODISCECTOMY LUMBAR LAMINECTOMY - Left L5-S1 Microdiscectomy with MIS Equipment  Patient Location: PACU  Anesthesia Type: General  Level of Consciousness: awake, alert , oriented and pateint uncooperative  Airway & Oxygen Therapy: Patient Spontanous Breathing and Patient connected to face mask oxygen  Post-op Assessment: Report given to PACU RN, Post -op Vital signs reviewed and stable and Patient moving all extremities X 4  Post vital signs: Reviewed and stable  Complications: No apparent anesthesia complications

## 2011-09-04 NOTE — Brief Op Note (Addendum)
09/04/2011  4:17 PM  PATIENT:  Jerry Scott  36 y.o. male  PRE-OPERATIVE DIAGNOSIS:  left L5-S1 herniated nucleus pulposus with lateral recess stenosis  POST-OPERATIVE DIAGNOSIS:  left L5-S1 herniated nucleus pulposus with lateral recess   PROCEDURE:  Procedure(s): MICRODISCECTOMY LUMBAR LAMINECTOMY Left lateral recess decompression.  SURGEON:  Surgeon(s): Kerrin Champagne, MD  PHYSICIAN ASSISTANT:Sheila Vernon,PA-C   ANESTHESIA:   local and general  EBL:  Total I/O In: 1000 [I.V.:1000] Out: 125 [Blood:125]  BLOOD ADMINISTERED:none  DRAINS: none   LOCAL MEDICATIONS USED:  MARCAINE 20CC  SPECIMEN:  No Specimen  DISPOSITION OF SPECIMEN:  N/A  COUNTS:  YES  TOURNIQUET:  * No tourniquets in log *  DICTATION: Telephone transcription  #[id number]  PLAN OF CARE: Admit for overnight observation  PATIENT DISPOSITION:  PACU - hemodynamically stable.   Delay start of Pharmacological VTE agent (>24hrs) due to surgical blood loss or risk of bleeding:  yes

## 2011-09-04 NOTE — Anesthesia Procedure Notes (Addendum)
Procedure Name: Intubation Date/Time: 09/04/2011 1:10 PM Performed by: Carmela Rima Pre-anesthesia Checklist: Patient identified, Timeout performed, Emergency Drugs available, Patient being monitored and Suction available Patient Re-evaluated:Patient Re-evaluated prior to inductionOxygen Delivery Method: Circle System Utilized Preoxygenation: Pre-oxygenation with 100% oxygen Intubation Type: IV induction Ventilation: Mask ventilation without difficulty Laryngoscope Size: Mac and 3 Grade View: Grade I Tube type: Oral Tube size: 7.5 mm Number of attempts: 1 Airway Equipment and Method: patient positioned with wedge pillow Placement Confirmation: ETT inserted through vocal cords under direct vision,  positive ETCO2,  CO2 detector and breath sounds checked- equal and bilateral Secured at: 24 cm Tube secured with: Tape Dental Injury: Teeth and Oropharynx as per pre-operative assessment

## 2011-09-04 NOTE — Anesthesia Preprocedure Evaluation (Addendum)
Anesthesia Evaluation  Patient identified by MRN, date of birth, ID band Patient awake    Reviewed: Allergy & Precautions, H&P , NPO status , Patient's Chart, lab work & pertinent test results  Airway Mallampati: II      Dental   Pulmonary shortness of breath and with exertion, Current Smoker,  clear to auscultation        Cardiovascular hypertension, + dysrhythmias Regular Normal    Neuro/Psych Anxiety Depression    GI/Hepatic Neg liver ROS, GERD-  ,  Endo/Other  Negative Endocrine ROS  Renal/GU negative Renal ROS     Musculoskeletal   Abdominal   Peds  Hematology negative hematology ROS (+) HIV,   Anesthesia Other Findings   Reproductive/Obstetrics                          Anesthesia Physical Anesthesia Plan  ASA: III  Anesthesia Plan: General   Post-op Pain Management:    Induction: Intravenous  Airway Management Planned: Oral ETT  Additional Equipment:   Intra-op Plan:   Post-operative Plan:   Informed Consent: I have reviewed the patients History and Physical, chart, labs and discussed the procedure including the risks, benefits and alternatives for the proposed anesthesia with the patient or authorized representative who has indicated his/her understanding and acceptance.     Plan Discussed with: CRNA  Anesthesia Plan Comments:         Anesthesia Quick Evaluation

## 2011-09-04 NOTE — Op Note (Signed)
DIctation   [phone] Formal telephone dictation.

## 2011-09-04 NOTE — Preoperative (Signed)
Beta Blockers   Reason not to administer Beta Blockers:Not Applicable 

## 2011-09-04 NOTE — H&P (Signed)
Jerry Scott is an 36 y.o. male.   Chief Complaint:Left leg and back pain. HPI:36 year old male with Back and leg pain since on the job injury, cart he was pushing caught On floor wretching back with back pain and left leg pain since that time. No bowel or bladder Dysfunction. MRI with central HNP with extension to left causing S1 nerve compression. Failed attempts at conservative treatment, requiring persistent narcotic medication, Pain 6/10 presently. Presents for left L5-S1 microdiskectomy.  Past Medical History  Diagnosis Date  . Shortness of breath     due to "smoking"  . HIV (human immunodeficiency virus infection)   . GERD (gastroesophageal reflux disease)   . Hypertension     sees Dr. Levada Dy [phone]  . Dysrhythmia     had echo done over 1 year ago at [facility] hosp/ "normal"    Past Surgical History  Procedure Date  . Hemorrhoid surgery     History reviewed. No pertinent family history. Social History:  reports that he has been smoking Cigarettes.  He has smoked for the past 15 years. He does not have any smokeless tobacco history on file. He reports that he drinks alcohol. He reports that he uses illicit drugs (Marijuana).  Allergies: No Known Allergies  Medications Prior to Admission  Medication Dose Route Frequency Provider Last Rate Last Dose  . ceFAZolin (ANCEF) IVPB 2 g/50 mL premix  2 g Intravenous 60 min Pre-Op Kerrin Champagne, MD      . chlorhexidine (HIBICLENS) 4 % liquid 4 application  60 mL Topical Once Kerrin Champagne, MD      . lactated ringers infusion   Intravenous Continuous Kerby Nora, MD 50 mL/hr at 09/04/11 1139     No current outpatient prescriptions on file as of 09/04/2011.    No results found for this or any previous visit (from the past 48 hour(s)). Dg Chest 2 View  09/04/2011  *RADIOLOGY REPORT*  Clinical Data: Microdiskectomy.  Preop radiograph.  CHEST - 2 VIEW  Comparison: 12/01/2010  Findings: The heart size and mediastinal contours are  within normal limits.  Both lungs are clear.  The visualized skeletal structures are unremarkable.  IMPRESSION: Negative exam.  Original Report Authenticated By: Rosealee Albee, M.D.    Review of Systems  Constitutional: Negative for fever, chills, weight loss, malaise/fatigue and diaphoresis.  HENT: Negative for hearing loss, ear pain, nosebleeds, congestion, sore throat, neck pain, tinnitus and ear discharge.   Eyes: Negative for blurred vision, double vision, photophobia, pain, discharge and redness.  Respiratory: Negative for cough, hemoptysis, sputum production, shortness of breath, wheezing and stridor.   Cardiovascular: Negative for chest pain, palpitations, orthopnea, claudication, leg swelling and PND.  Gastrointestinal: Negative for heartburn, nausea, vomiting, abdominal pain, diarrhea, constipation, blood in stool and melena.  Genitourinary: Negative for dysuria, urgency, frequency, hematuria and flank pain.  Musculoskeletal: Positive for back pain. Negative for myalgias, joint pain and falls.  Skin: Negative for itching and rash.  Neurological: Positive for tingling, sensory change, focal weakness and weakness. Negative for dizziness, tremors, speech change, seizures, loss of consciousness and headaches.  Endo/Heme/Allergies: Negative for environmental allergies and polydipsia. Does not bruise/bleed easily.  Psychiatric/Behavioral: Negative for depression, suicidal ideas, hallucinations, memory loss and substance abuse. The patient is not nervous/anxious and does not have insomnia.     Blood pressure 156/91, pulse 66, temperature 98.4 F (36.9 C), temperature source Oral, resp. rate 20, SpO2 98.00%. Physical Exam  Constitutional: He is oriented to  person, place, and time. He appears well-developed and well-nourished. No distress.  HENT:  Head: Normocephalic and atraumatic.  Right Ear: External ear normal.  Left Ear: External ear normal.  Nose: Nose normal.  Mouth/Throat:  Oropharynx is clear and moist. No oropharyngeal exudate.  Eyes: Conjunctivae and EOM are normal. Pupils are equal, round, and reactive to light. Right eye exhibits no discharge. Left eye exhibits no discharge. No scleral icterus.  Neck: Normal range of motion. Neck supple. No JVD present. No tracheal deviation present. No thyromegaly present.  Cardiovascular: Normal rate, regular rhythm, normal heart sounds and intact distal pulses.  Exam reveals no gallop and no friction rub.   No murmur heard. Respiratory: Effort normal and breath sounds normal. No stridor. No respiratory distress. He has no wheezes. He has no rales. He exhibits no tenderness.  GI: Soft. Bowel sounds are normal. He exhibits no distension and no mass. There is no tenderness. There is no rebound and no guarding.  Genitourinary: Rectum normal, prostate normal and penis normal. Guaiac negative stool. No penile tenderness.  Musculoskeletal: He exhibits no edema and no tenderness.  Lymphadenopathy:    He has no cervical adenopathy.  Neurological: He is alert and oriented to person, place, and time. He has normal reflexes. He displays normal reflexes. No cranial nerve deficit. He exhibits normal muscle tone. Coordination normal.  Skin: Skin is warm and dry. No rash noted. He is not diaphoretic. No erythema. No pallor.  Psychiatric: He has a normal mood and affect. His behavior is normal. Judgment and thought content normal.  Ortho: Reduced ROM L-S spine, Positive SLR left leg at 40 degrees, left foot EHL 4/5 weakness..   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  Plan:OR for left L5-S1 Microdiskectomy.  Cora Brierley E 09/04/2011, 12:40 PM

## 2011-09-05 MED ORDER — ONDANSETRON HCL 4 MG PO TABS: 4.0000 mg | ORAL_TABLET | Freq: Three times a day (TID) | ORAL | Status: AC | PRN

## 2011-09-05 MED ORDER — METHOCARBAMOL 500 MG PO TABS: 500.0000 mg | ORAL_TABLET | Freq: Four times a day (QID) | ORAL | Status: AC | PRN

## 2011-09-05 MED ORDER — OXYCODONE-ACETAMINOPHEN 10-325 MG PO TABS: 1.0000 | ORAL_TABLET | ORAL | Status: AC | PRN

## 2011-09-05 NOTE — Discharge Summary (Signed)
Physician Discharge Summary  Patient ID: Jerry Scott MRN: [id number] DOB/AGE: 05/28/1976 36 y.o.  Admit date: 09/04/2011 Discharge date: 09/05/2011  Admission Diagnoses:  Spinal stenosis of lumbar region with neurogenic claudication  Discharge Diagnoses:  Principal Problem:  *Spinal stenosis of lumbar region with neurogenic claudication Active Problems:  Herniated nucleus pulposus, lumbar   Past Medical History  Diagnosis Date  . Shortness of breath     due to "smoking"  . HIV (human immunodeficiency virus infection)   . GERD (gastroesophageal reflux disease)   . Hypertension     sees Dr. Levada Dy [phone]  . Dysrhythmia     had echo done over 1 year ago at [facility] hosp/ "normal"    Surgeries: Procedure(s): MICRODISCECTOMY LUMBAR LAMINECTOMY on 09/04/2011   Consultants (if any):    Discharged Condition: Improved  Hospital Course: Jerry Scott is an 36 y.o. male who was admitted 09/04/2011 with a diagnosis of Spinal stenosis of lumbar region with neurogenic claudication and went to the operating room on 09/04/2011 and underwent the above named procedures.    He was given perioperative antibiotics:  Anti-infectives     Start     Dose/Rate Route Frequency Ordered Stop   09/04/11 2200   ceFAZolin (ANCEF) IVPB 1 g/50 mL premix        1 g 100 mL/hr over 30 Minutes Intravenous 3 times per day 09/04/11 1755 09/05/11 1359   09/04/11 2000  efavirenz-emtrictabine-tenofovir (ATRIPLA) 600-200-300 MG per tablet 1 tablet       1 tablet Oral Daily 09/04/11 1755     09/03/11 1445   ceFAZolin (ANCEF) IVPB 2 g/50 mL premix        2 g 100 mL/hr over 30 Minutes Intravenous 60 min pre-op 09/03/11 1439 09/04/11 1319        .  He was given sequential compression devices, early ambulation, and chemoprophylaxis for DVT prophylaxis.  He benefited maximally from their hospital stay and there were no complications.    Recent vital signs:  Filed Vitals:   09/05/11 0521  BP: 126/80    Pulse: 69  Temp: 97.8 F (36.6 C)  Resp: 18    Recent laboratory studies:  Lab Results  Component Value Date   HGB 14.8 08/31/2011   HGB 13.8 12/02/2010   HGB 15.0 12/01/2010   Lab Results  Component Value Date   WBC 4.5 08/31/2011   PLT 179 08/31/2011   No results found for this basename: INR   Lab Results  Component Value Date   NA 142 08/31/2011   K 4.1 08/31/2011   CL 107 08/31/2011   CO2 25 08/31/2011   BUN 10 08/31/2011   CREATININE 0.97 08/31/2011   GLUCOSE 100* 08/31/2011    Discharge Medications:   Current Discharge Medication List    START taking these medications   Details  methocarbamol (ROBAXIN) 500 MG tablet Take 1 tablet (500 mg total) by mouth 4 (four) times daily as needed. Qty: 30 tablet, Refills: 0    ondansetron (ZOFRAN) 4 MG tablet Take 1 tablet (4 mg total) by mouth every 8 (eight) hours as needed for nausea. Qty: 20 tablet, Refills: 0    oxyCODONE-acetaminophen (PERCOCET) 10-325 MG per tablet Take 1 tablet by mouth every 4 (four) hours as needed for pain. Qty: 60 tablet, Refills: 0      CONTINUE these medications which have NOT CHANGED   Details  efavirenz-emtrictabine-tenofovir (ATRIPLA) 600-200-300 MG per tablet Take 1 tablet by mouth daily.  hydrochlorothiazide (HYDRODIURIL) 25 MG tablet Take 25 mg by mouth daily.        STOP taking these medications     HYDROcodone-acetaminophen (NORCO) 10-325 MG per tablet         Diagnostic Studies: Dg Chest 2 View  09/04/2011  *RADIOLOGY REPORT*  Clinical Data: Microdiskectomy.  Preop radiograph.  CHEST - 2 VIEW  Comparison: 12/01/2010  Findings: The heart size and mediastinal contours are within normal limits.  Both lungs are clear.  The visualized skeletal structures are unremarkable.  IMPRESSION: Negative exam.  Original Report Authenticated By: Rosealee Albee, M.D.   Dg Lumbar Spine 1 View  09/04/2011  *RADIOLOGY REPORT*  Clinical Data: L5-S1 microdiskectomy.  LUMBAR SPINE - 1 VIEW   Comparison: MRI lumbar spine 06/19/2011  Findings: There are two posterior surgical needle markers.  The upper marker is at L4-L5 and the lower marker is at L5-S1.  Stable alignment of the lumbar spine and the vertebral body heights are maintained.  IMPRESSION: Surgical marking at L4-L5 and L5-S1.  Original Report Authenticated By: Richarda Overlie, M.D.   Dg Spine Portable 1 View  09/04/2011  *RADIOLOGY REPORT*  Clinical Data: Left L5-S1 microdiskectomy.  PORTABLE SPINE - 1 VIEW  Comparison: Earlier today and MR dated 06/19/2011.  Findings: The patient was previously shown to have transitional anatomy with the last open disc space labeled the S1-2 level on the previous MR.  Using that labeling, a metallic localizer is demonstrated with its tip overlying the posterior, inferior aspect of the L5-S1 disc.  IMPRESSION: Localizer at the L5-S1 level.  Original Report Authenticated By: Darrol Angel, M.D.   Dg C-arm 1-60 Min-no Report  09/04/2011  CLINICAL DATA: align mis instruments for microdiscectomy   C-ARM 1-60 MINUTES  Fluoroscopy was utilized by the requesting physician.  No radiographic  interpretation.      Disposition: Home or Self Care  Discharge Orders    Future Orders Please Complete By Expires   Diet - low sodium heart healthy      Call MD / Call 911      Comments:   If you experience chest pain or shortness of breath, CALL 911 and be transported to the hospital emergency room.  If you develope a fever above 101 F, pus (white drainage) or increased drainage or redness at the wound, or calf pain, call your surgeon's office.   Constipation Prevention      Comments:   Drink plenty of fluids.  Prune juice may be helpful.  You may use a stool softener, such as Colace (over the counter) 100 mg twice a day.  Use MiraLax (over the counter) for constipation as needed.   Increase activity slowly as tolerated      Weight Bearing as taught in Physical Therapy      Comments:   Use a walker or crutches as  instructed.   Discharge instructions      Comments:   Keep incision dry for 4 days then may shower or bathe changing dressing after bathing. No bending or stooping or lifting greater than 10 lbs for 6 weeks. No driving or operation of dangerous equipment for at least 6 weeks and off all pain meds. Call for a return appointment at my office, [facility], [phone] Dr.Nitka.   Driving restrictions      Comments:   No driving for 6 weeks   Lifting restrictions      Comments:   No lifting greater than 10 lbs for 6  weeks.      Follow-up Information    Follow up with NITKA,JAMES E, MD. Make an appointment in 1 week.   Contact information:   [employer] [address] [place] [address] [phone]           Signed: Kathryne Hitch 09/05/2011, 6:27 AM

## 2011-09-05 NOTE — Op Note (Signed)
NAMEMarland Kitchen  BARCLAY, LENNOX NO.:  [id number]  MEDICAL RECORD NO.:  [id number]  LOCATION:  5035                         FACILITY:  [facility]  PHYSICIAN:  Kerrin Champagne, M.D.   DATE OF BIRTH:  1975-10-03  DATE OF PROCEDURE:  09/04/2011 DATE OF DISCHARGE:                              OPERATIVE REPORT   PREOPERATIVE DIAGNOSIS:  Left L5-S1 herniated nucleus pulposus.  POSTOPERATIVE DIAGNOSIS:  Left L5-S1 herniated nucleus pulposus with lateral recess stenosis.  PROCEDURE PERFORMED DURING THIS CASE:  Microdiskectomy, lumbar left L5- S1 with left lateral recess decompression and diskectomy.  Operating room microscope was used during this procedure.  SURGEON:  Kerrin Champagne, MD  ASSISTANT:  Wende Neighbors, PA-C  ANESTHESIA:  General via orotracheal intubation, supplemented with local infiltration with Marcaine 0.5% with 1:200,000 epinephrine, total of 20 mL.  ANESTHESIOLOGIST:  Zenon Mayo, MD  ESTIMATED BLOOD LOSS:  125 mL.  DRAINS:  None.  SPECIMENS:  None.  Counts were correct.  No tourniquet used during this procedure.  DESCRIPTION OF PROCEDURE:  This patient was seen in the preoperative holding area, had marking of his left side at the expected L5-S1 level with an X in my initials.  He received standard preoperative antibiotics of Ancef, was transported to the OR at [facility] via stretcher, note that all questions were answered in the preop holding area.  Transportation to OR room number 15 at [facility]. There, he underwent induction of general anesthesia on the stretcher. This was done without difficulty and atraumatically.  He was then transferred to the OR table.  Reverse OR table was used.  This was a sliding table with chest rolls.  All pressure points were well padded. The patient had PAS hose to prevent DVT.  The arms were at this side 90/90.  Standard prep with DuraPrep solution over the lower lumbar spine extending  from the lumbodorsal junction to the mid sacral level with DuraPrep solution, draped in the usual manner.  Iodine Vi-Drape was used.  Standard time-out protocol was carried out identifying the patient, procedure.  Two spinal needles were placed.  Intraoperative lateral radiograph demonstrated the needles at the L4-5 level and at the L5-S1 level the lower needle.  Based on this, an incision was made between the needles and the expected placement of the tubes for the MIS system were directed downwards through the L5-S1 level.  The patient has a transition segment.  This was noted on the plain radiographs as well. The skin was infiltrated with Marcaine 0.5% with 1:200,000 epinephrine, total of 20 mL was used.  The incision was about an inch or inch and half in length through the skin and subcutaneous layers down to the lumbodorsal fascia.  This was incised on the left side of spinous process at the L5-S1 level, and a small dilator introduced then over the lamina of L5.  This was then used to carefully remove soft tissue attachments off of the lamina and then dilating tubes were then placed over this dilating up to a full 13-mm diameter dilator.  The depth measured at about 80 mm.  An 80-mm thin blades were then added to  the scaffolding for the MIS equipment and this was carefully placed over the dilators.  Note that the C-arm fluoro was draped sterilely and brought into the field and this was used to ascertain the correct position and alignment of the dilators and the retractors after they were placed at the expected L4-5 level, which was L5-S1 on this procedure.  The patient had a rudimentary disk at the S1-S2 level.  With this, then the retractor was then set into place and then attached to the arm and the equipment was then attached to the OR table to stabilize the patient's scaffold and the retractor system.  A lateral blade was placed at 90 mm. The muscle attachments over the inferior  aspect of the lamina of L5 were then carefully debrided down to the lower aspect of the lamina of L5.  A high-speed bur was then used to remove bone over the medial aspect of the inferior articular process of L5, about 20% removed as well as the semi-hemilaminectomy performed using a high-speed bur, a 3-mm cutting bur.  The ligamentum flavum left intact while doing this protecting nerve roots and the thecal sac.  Intraoperative localization was performed using a Penfield 4 placed, depth was set for L5-S1.  This was carefully evaluated.  Felt to be the second disk space from the caudal extent of the spine and representative of the L5-S1 level, which was transitional segment at the S1-S2 and this was the second open disk space.  With this, then carefully a 2-mm Kerrison was used to detach the ligamentum flavum attachment along the medial aspect of the superior articular process of S1 on the left side and then the ligamentum flavum was resected off the ventral inferior aspect of the L5 vertebral body. This freed up the ligamentum flavum.  Using a nerve hook, then ligamentum flavum was able to be retracted caudally and then resected using 2 and 3 mm Kerrisons.  Foraminotomy was performed over the left S1 nerve root, decompressing the nerve root and the left S1 neural foramen as well as decompression of the lateral recess along the left side out to the level of the pedicle.  When the ligamentum flavum had been completely resected, then off the medial aspect of this facet joint hockey-stick neural probe could then be passed out the S1 and L5 neural foramen demonstrating their patencies.  It was quite tight in the subarticular and sub ligamentum flavum region where the S1 nerve root was negotiated lateral recess to exit out the neural foramen and the nerve root was carefully protected.  He was then mobilized.  A small vein was cauterized and divided, which was holding the S1 nerve root into the  lateral recess and this allowed for further mobilization of the nerve root and thecal sac medially and a Love retractor inserted and used to retract the thecal sac in the S1 nerve root quite nicely.  With this, then the thecal sac and the S1 nerve root were retracted and the disk space examined.  There did appear to be protrusion of disk centrally to the left side and this was observed using a Penfield 4.  A vertical incision was made in the disk space and pituitary rongeur used to remove the disk material.  Small amount of material was found to be present and this was resected.  With this, then a hockey-stick neural probe could be passed over the posterior aspect of the disk space demonstrating no further compression of the thecal sac or compression  on the thecal sac or the disk at the L5-S1 level.  Hockey-stick could also be passed out the S1 neural foramen and the L5 neural foramen demonstrating patency of these particular neural foramen.  No further nerve compression.  Irrigation was carried out.  There was no active bleeding present.  Carefully, then the retractors were rotated as they were withdrawn in a position such that the instruments could be retracted.  The canting removed from the blades and the blades brought into a position of closeness that approximated the size of the initial dilators.  With this, then irrigation was carried out.  The lumbodorsal fascia was carefully approximated with 0 Vicryl sutures on a UR-6 needle, 2 sutures applied.  Deep subcu layers were approximated with interrupted 0 Vicryl sutures, more superficial layers with interrupted 2- 0 Vicryl sutures, and skin closed with a running subcu stitch of 4-0 Vicryl.  Dermabond was then applied and Mepilex bandage.  Note that then a lateral radiograph was obtained at the end of the case with a Penfield 4 in the disk space at the L5-S1 level, so that this documented the procedure, the position, the alignment, and  the level during this case.  The patient was then returned to a supine position, reactivated, extubated, returned to recovery room in satisfactory condition.  All instrument and sponge counts were correct.     Kerrin Champagne, M.D.     JEN/MEDQ  D:  09/04/2011  T:  09/05/2011  Job:  [id number]

## 2011-09-05 NOTE — Progress Notes (Signed)
Patient ID: Jerry Scott, male   DOB: May 20, 1976, 36 y.o.   MRN: [id number] Pt. Discharged 09/05/2011  8:07 AM Discharge instructions reviewed with patient/family. Patient/family verbalized understanding. All Rx's given. Questions answered as needed. Pt. Discharged to home with family/self. Taken off unit via [facility]. Lurline [facility]

## 2011-09-07 ENCOUNTER — Encounter (HOSPITAL_COMMUNITY): Payer: Self-pay | Admitting: Specialist

## 2011-09-07 MED FILL — Hydromorphone HCl Inj 1 MG/ML: INTRAMUSCULAR | Qty: 1 | Status: AC

## 2011-09-10 ENCOUNTER — Telehealth: Payer: Self-pay | Admitting: *Deleted

## 2011-09-16 NOTE — Telephone Encounter (Signed)
Error

## 2011-10-13 ENCOUNTER — Ambulatory Visit: Payer: PRIVATE HEALTH INSURANCE | Attending: Specialist | Admitting: Physical Therapy

## 2011-10-28 ENCOUNTER — Ambulatory Visit: Payer: PRIVATE HEALTH INSURANCE | Admitting: Physical Therapy

## 2011-11-06 ENCOUNTER — Ambulatory Visit: Payer: PRIVATE HEALTH INSURANCE | Admitting: Physical Therapy

## 2012-01-27 ENCOUNTER — Ambulatory Visit: Payer: PRIVATE HEALTH INSURANCE | Attending: Specialist

## 2012-01-27 DIAGNOSIS — M545 Low back pain, unspecified: Secondary | ICD-10-CM | POA: Insufficient documentation

## 2012-01-27 DIAGNOSIS — IMO0001 Reserved for inherently not codable concepts without codable children: Secondary | ICD-10-CM | POA: Insufficient documentation

## 2012-02-01 ENCOUNTER — Ambulatory Visit: Payer: PRIVATE HEALTH INSURANCE | Attending: Specialist

## 2012-02-01 DIAGNOSIS — M545 Low back pain, unspecified: Secondary | ICD-10-CM | POA: Insufficient documentation

## 2012-02-01 DIAGNOSIS — IMO0001 Reserved for inherently not codable concepts without codable children: Secondary | ICD-10-CM | POA: Insufficient documentation

## 2012-02-03 ENCOUNTER — Ambulatory Visit: Payer: PRIVATE HEALTH INSURANCE

## 2012-02-11 ENCOUNTER — Ambulatory Visit: Payer: PRIVATE HEALTH INSURANCE | Admitting: Physical Therapy

## 2012-02-24 ENCOUNTER — Encounter: Payer: PRIVATE HEALTH INSURANCE | Admitting: Physical Therapy

## 2012-07-04 ENCOUNTER — Encounter: Payer: Self-pay | Admitting: Family Medicine

## 2012-12-24 IMAGING — CR DG SPINE 1V PORT
1 series · 1 of 1 positions shown · non-contrast
Comparison: Earlier today and MR dated 06/19/2011.

CLINICAL DATA: Left L5-S1 microdiskectomy.

PORTABLE SPINE - 1 VIEW

[view not recorded]
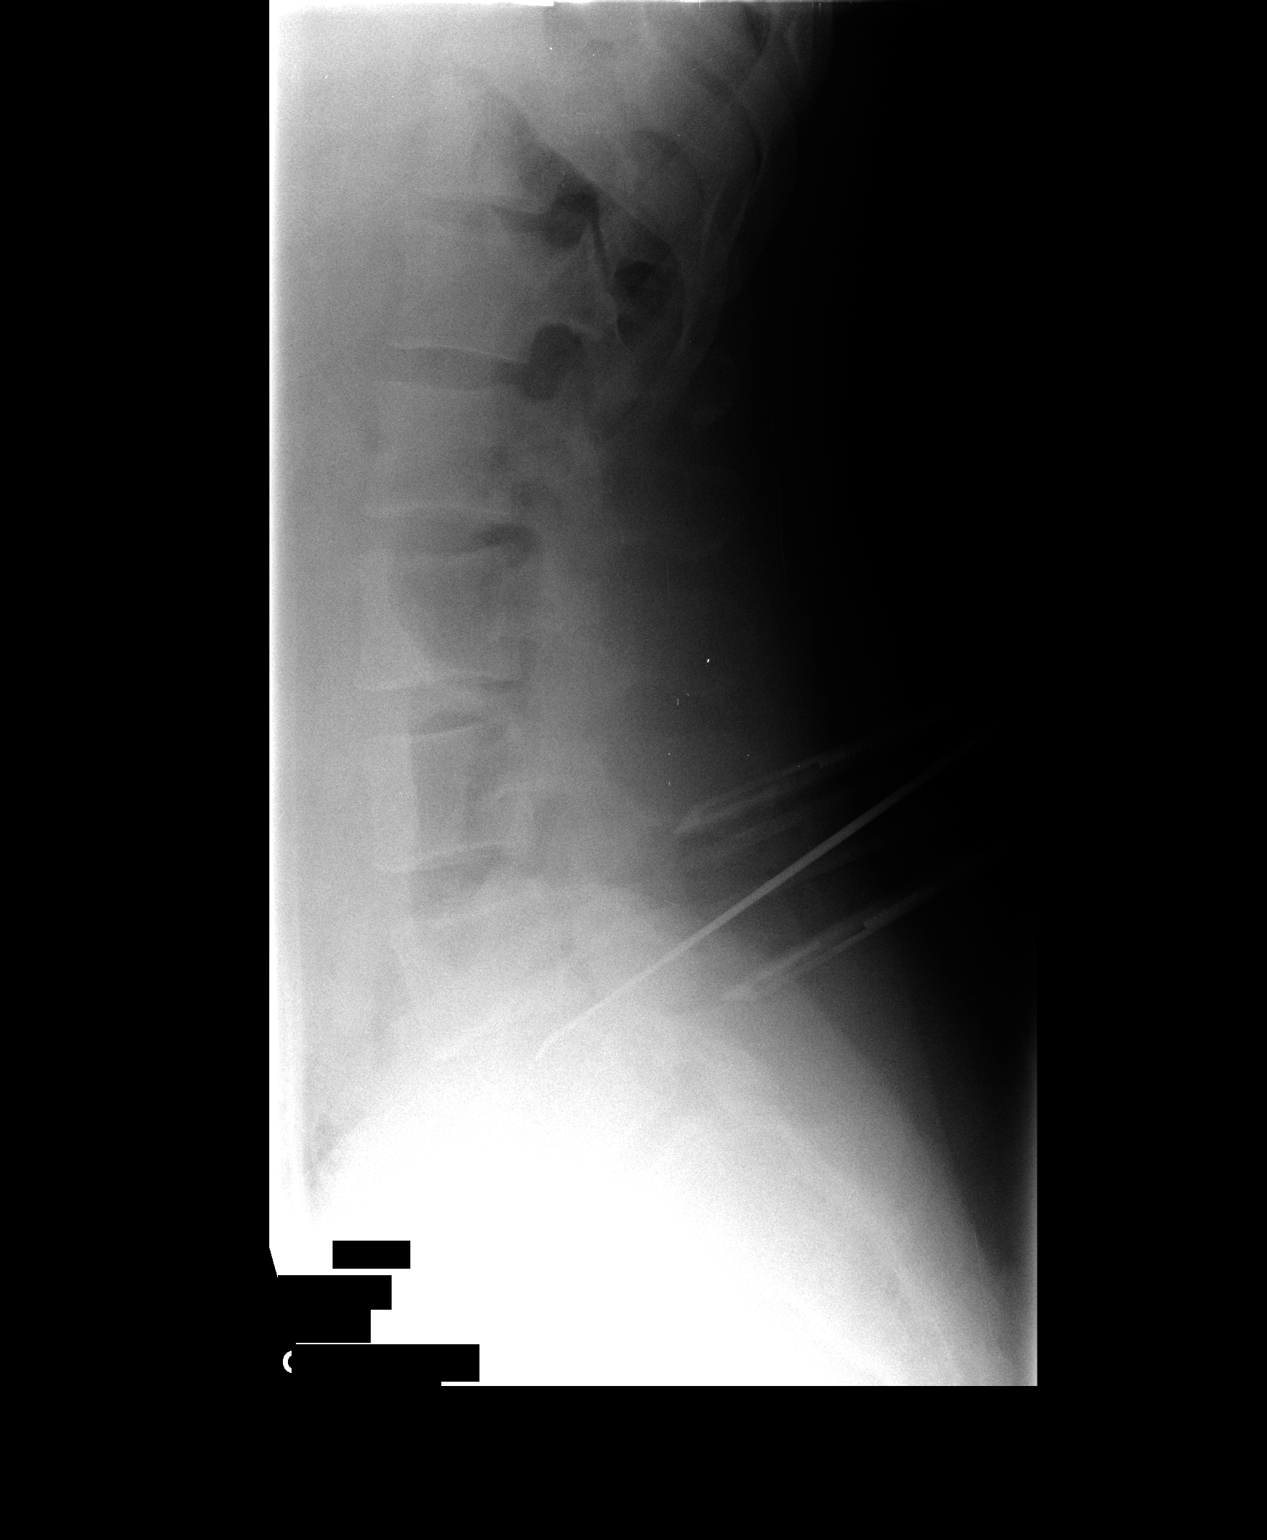

[1 of 1 positions shown; findings below may reference images not displayed]

FINDINGS: The patient was previously shown to have transitional
anatomy with the last open disc space labeled the S1-2 level on the
previous MR.  Using that labeling, a metallic localizer is
demonstrated with its tip overlying the posterior, inferior aspect
of the L5-S1 disc.
IMPRESSION: Localizer at the L5-S1 level.

## 2012-12-24 IMAGING — CR DG CHEST 2V
2 series · 2 of 2 positions shown · non-contrast
Comparison: 12/01/2010

CLINICAL DATA: Microdiskectomy.  Preop radiograph.

CHEST - 2 VIEW

[view not recorded (1 of 2)]
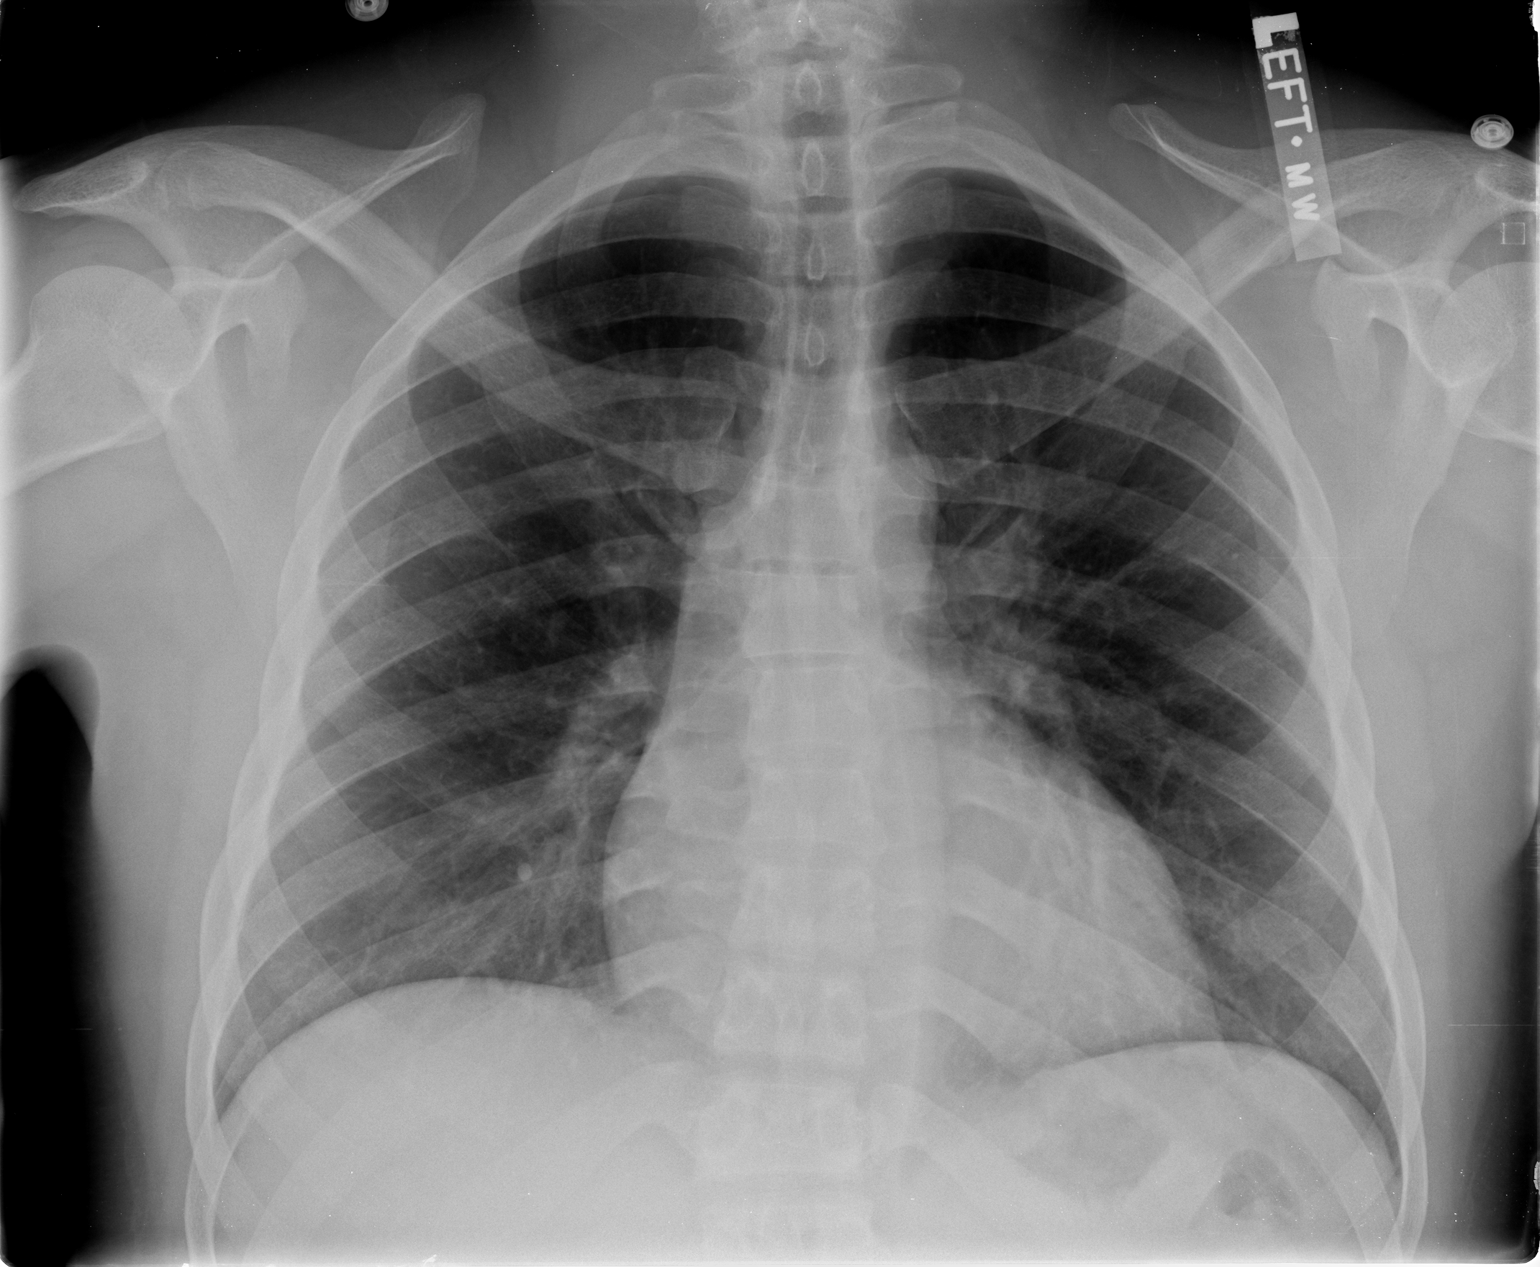

[view not recorded (2 of 2)]
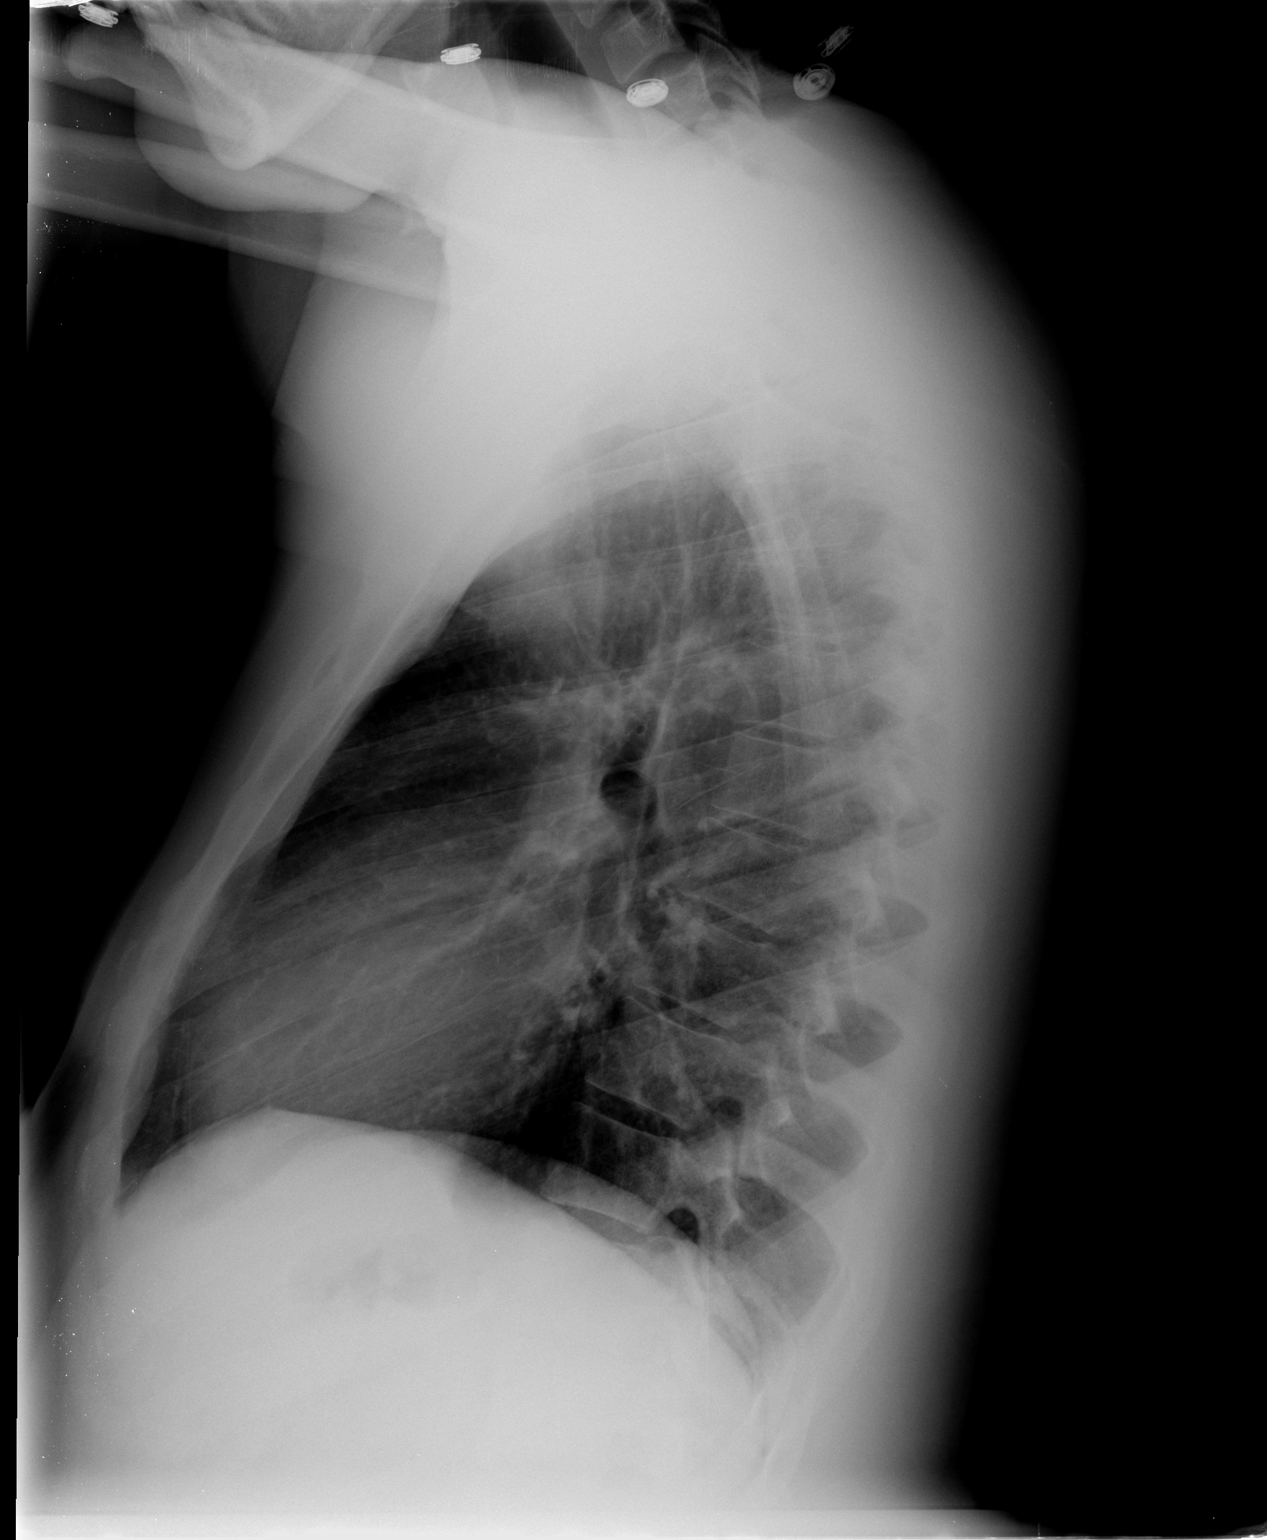

[2 of 2 positions shown; findings below may reference images not displayed]

FINDINGS: The heart size and mediastinal contours are within normal
limits.  Both lungs are clear.  The visualized skeletal structures
are unremarkable.
IMPRESSION: Negative exam.

## 2013-02-07 ENCOUNTER — Other Ambulatory Visit (HOSPITAL_COMMUNITY): Payer: Self-pay | Admitting: Specialist

## 2013-02-07 DIAGNOSIS — M545 Low back pain, unspecified: Secondary | ICD-10-CM

## 2013-02-08 ENCOUNTER — Ambulatory Visit (HOSPITAL_COMMUNITY)
Admission: RE | Admit: 2013-02-08 | Discharge: 2013-02-08 | Disposition: A | Discharge status: Home / Self Care | Payer: Self-pay | Source: Ambulatory Visit | Attending: Specialist | Admitting: Specialist

## 2013-02-08 DIAGNOSIS — M4120 Other idiopathic scoliosis, site unspecified: Secondary | ICD-10-CM | POA: Insufficient documentation

## 2013-02-08 DIAGNOSIS — M545 Low back pain, unspecified: Secondary | ICD-10-CM | POA: Insufficient documentation

## 2013-02-08 LAB — CREATININE, SERUM
Creatinine, Ser: 1.02 mg/dL (ref 0.50–1.35)
GFR calc Af Amer: >90 mL/min (ref >90)

## 2013-02-08 MED ORDER — GADOBENATE DIMEGLUMINE 529 MG/ML IV SOLN
20.0000 mL | Freq: Once | INTRAVENOUS | Status: AC
  Administered 2013-02-08: 20 mL via INTRAVENOUS

## 2013-02-09 ENCOUNTER — Ambulatory Visit (HOSPITAL_COMMUNITY): Payer: Self-pay

## 2013-03-27 ENCOUNTER — Ambulatory Visit: Payer: PRIVATE HEALTH INSURANCE | Admitting: Neurology

## 2013-04-12 ENCOUNTER — Ambulatory Visit: Payer: Self-pay | Admitting: Neurology

## 2013-04-21 ENCOUNTER — Emergency Department (HOSPITAL_COMMUNITY)
Admission: EM | Admit: 2013-04-21 | Discharge: 2013-04-21 | Disposition: A | Discharge status: Home / Self Care | Payer: Self-pay | Attending: Emergency Medicine | Admitting: Emergency Medicine

## 2013-04-21 ENCOUNTER — Encounter (HOSPITAL_COMMUNITY): Payer: Self-pay

## 2013-04-21 DIAGNOSIS — R6883 Chills (without fever): Secondary | ICD-10-CM | POA: Insufficient documentation

## 2013-04-21 DIAGNOSIS — L0291 Cutaneous abscess, unspecified: Secondary | ICD-10-CM

## 2013-04-21 DIAGNOSIS — F17200 Nicotine dependence, unspecified, uncomplicated: Secondary | ICD-10-CM | POA: Insufficient documentation

## 2013-04-21 DIAGNOSIS — Z8679 Personal history of other diseases of the circulatory system: Secondary | ICD-10-CM | POA: Insufficient documentation

## 2013-04-21 DIAGNOSIS — B20 Human immunodeficiency virus [HIV] disease: Secondary | ICD-10-CM

## 2013-04-21 DIAGNOSIS — IMO0002 Reserved for concepts with insufficient information to code with codable children: Secondary | ICD-10-CM | POA: Insufficient documentation

## 2013-04-21 DIAGNOSIS — Z8719 Personal history of other diseases of the digestive system: Secondary | ICD-10-CM | POA: Insufficient documentation

## 2013-04-21 DIAGNOSIS — I10 Essential (primary) hypertension: Secondary | ICD-10-CM | POA: Insufficient documentation

## 2013-04-21 DIAGNOSIS — Z21 Asymptomatic human immunodeficiency virus [HIV] infection status: Secondary | ICD-10-CM | POA: Insufficient documentation

## 2013-04-21 DIAGNOSIS — Z79899 Other long term (current) drug therapy: Secondary | ICD-10-CM | POA: Insufficient documentation

## 2013-04-21 MED ORDER — CEPHALEXIN 500 MG PO CAPS: 500.0000 mg | ORAL_CAPSULE | Freq: Four times a day (QID) | ORAL | Status: DC

## 2013-04-21 NOTE — ED Notes (Signed)
Pt has an abcess under his left arm for several weeks, very painful

## 2013-04-21 NOTE — ED Provider Notes (Signed)
CSN: [id number]     Arrival date & time 04/21/13  1478 History     First MD Initiated Contact with Patient 04/21/13 0421     Chief Complaint  Patient presents with  . Recurrent Skin Infections   (Consider location/radiation/quality/duration/timing/severity/associated sxs/prior Treatment) The history is provided by the patient. No language interpreter was used.  Jerry Scott is a 37 y/o M with PMHx of HIV infection, GERD, HTN, dysrhythmia presenting to the ED with abscess underneath the left axilla. Patient reported that the abscess has been there for approximately 2 weeks and has gotten progressively larger. Patient reported that there is warmth to the touch and painful to touch. Patient reported pain with motion of the left arm and when wearing clothing. Reported that pain stays within the axilla, denied radiation. Reported that he has been using hot compressions. Associated symptoms are chills. Denied neck pain , neck stiffness, numbness, tingling, weakness, chest pain, shortness of breath, difficulty breathing. PCP none    Past Medical History  Diagnosis Date  . Shortness of breath     due to "smoking"  . HIV (human immunodeficiency virus infection)   . GERD (gastroesophageal reflux disease)   . Hypertension     sees Dr. Levada Dy [phone]  . Dysrhythmia     had echo done over 1 year ago at [facility] hosp/ "normal"   Past Surgical History  Procedure Laterality Date  . Hemorrhoid surgery    . Lumbar laminectomy  09/04/2011    Procedure: MICRODISCECTOMY LUMBAR LAMINECTOMY;  Surgeon: Kerrin Champagne, MD;  Location: [facility] OR;  Service: Orthopedics;  Laterality: N/A;  Left L5-S1 Microdiscectomy with MIS Equipment   History reviewed. No pertinent family history. History  Substance Use Topics  . Smoking status: Current Every Day Smoker -- 15 years    Types: Cigarettes  . Smokeless tobacco: Not on file  . Alcohol Use: Yes     Comment: occassional    Review of Systems  Constitutional:  Positive for chills.  HENT: Negative for neck pain.   Eyes: Negative for visual disturbance.  Respiratory: Negative for chest tightness and shortness of breath.   Gastrointestinal: Negative for nausea and vomiting.  Skin: Positive for wound.  Neurological: Negative for weakness and headaches.  All other systems reviewed and are negative.    Allergies  Review of patient's allergies indicates no known allergies.  Home Medications   Current Outpatient Rx  Name  Route  Sig  Dispense  Refill  . efavirenz-emtrictabine-tenofovir (ATRIPLA) 600-200-300 MG per tablet   Oral   Take 1 tablet by mouth daily.           . hydrochlorothiazide (HYDRODIURIL) 25 MG tablet   Oral   Take 25 mg by mouth daily.           . Oxycodone HCl 10 MG TABS   Oral   Take 10 mg by mouth every 6 (six) hours as needed (for pain).         . cephALEXin (KEFLEX) 500 MG capsule   Oral   Take 1 capsule (500 mg total) by mouth 4 (four) times daily.   40 capsule   0    BP 128/83  Pulse 91  Temp(Src) 98.7 F (37.1 C) (Oral)  Resp 18  Ht 5\' 11"  (1.803 m)  Wt 205 lb (92.987 kg)  BMI 28.6 kg/m2  SpO2 97% Physical Exam  Nursing note and vitals reviewed. Constitutional: He is oriented to person, place, and time. He appears well-developed  and well-nourished. No distress.  HENT:  Head: Normocephalic and atraumatic.  Eyes: Conjunctivae and EOM are normal. Pupils are equal, round, and reactive to light. Right eye exhibits no discharge. Left eye exhibits no discharge.  Neck: Normal range of motion. Neck supple.  Negative neck stiffness Negative nuchal rigidity Negative pain upon palpation to the cervical spine  Cardiovascular: Normal rate, regular rhythm and normal heart sounds.  Exam reveals no friction rub.   No murmur heard. Pulses:      Radial pulses are 2+ on the right side, and 2+ on the left side.  Pulmonary/Chest: Effort normal and breath sounds normal. No respiratory distress. He has no wheezes.  He has no rales.  Musculoskeletal: Normal range of motion.  Neurological: He is alert and oriented to person, place, and time. He exhibits normal muscle tone. Coordination normal.  Cranial nerves III through XII grossly intact Strength 5+/5+ with resistance to upper extremities bilaterally, equal  Skin: Skin is warm and dry. He is not diaphoretic.  Approximately 5 cm x 4 cm abscess located to the left axilla. Mild erythema noted with warmth upon palpation. Pain upon palpation. Negative active drainage, negative active bleeding. Negative streaking.   Psychiatric: He has a normal mood and affect. His behavior is normal. Thought content normal.    ED Course   Procedures (including critical care time)  INCISION AND DRAINAGE Performed by: Raymon Mutton Consent: Verbal consent obtained. Risks and benefits: risks, benefits and alternatives were discussed Type: abscess Body area: left axilla Anesthesia: local infiltration Incision was made with a scalpel. Local anesthetic: lidocaine 2% without epinephrine Anesthetic total: 4-5 ml Complexity: complex Blunt dissection to break up loculations Irrigation thoroughly performed Drainage: purulent Drainage amount: 10-15 cc Packing material: 1 in iodoform gauze Patient tolerance: Patient tolerated the procedure well with no immediate complications.   Labs Reviewed  WOUND CULTURE   No results found. 1. Abscess   2. HIV (human immunodeficiency virus infection)     MDM  Patient presenting to the emergency department with abscess to the left axilla has been ongoing and progressively enlarging over the past 2 weeks. Alert and oriented. Approximately 5 cm x 4 to abscess located to the left axilla-erythema, warmth noted upon palpation. Pain upon palpation to the abscess. Incision and drainage performed-adequate amount of pus and blood picture drained. Patient tolerated procedure well. Wound cultures obtained, results pending. Patient stable,  afebrile. Discharge patient. Discussed with patient wound care. Discussed with patient to remove packing within 3 days. Discussed with patient to follow primary care provider. Discussed with patient to continue to apply warm compressions to eat and loculations broken down. Discussed with patient to continue to monitor symptoms and if symptoms are to worsen or change to report back to emergency department-strict return instructions given Patient agreed to plan of care, understood, all questions answered.  Raymon Mutton, PA-C 04/23/13 1902

## 2013-04-21 NOTE — ED Notes (Signed)
Pt states he has had cyst under left arm for 2 weeks. Pt states he has tried different measures to try to get the cyst to drain but all have been unsuccessful. No drainage noted at this time.

## 2013-04-24 LAB — WOUND CULTURE

## 2013-04-24 NOTE — ED Provider Notes (Signed)
Medical screening examination/treatment/procedure(s) were performed by non-physician practitioner and as supervising physician I was immediately available for consultation/collaboration.  Marteze Vecchio M Tahj Lindseth, MD 04/24/13 2052 

## 2013-04-25 ENCOUNTER — Telehealth (HOSPITAL_COMMUNITY): Payer: Self-pay | Admitting: Emergency Medicine

## 2013-04-25 NOTE — ED Notes (Signed)
+  wound Stop keflex-Start Doxcycline 100 mg po BID x 10 days per Herbert Seta VanWingen

## 2013-04-25 NOTE — Progress Notes (Signed)
ED Antimicrobial Stewardship Positive Culture Follow Up   Jerry Scott is an 37 y.o. male who presented to [facility] on 04/21/2013 with a chief complaint of  Chief Complaint  Patient presents with  . Recurrent Skin Infections    Recent Results (from the past 720 hour(s))  WOUND CULTURE     Status: None   Collection Time    04/21/13  6:07 AM      Result Value Range Status   Specimen Description WOUND   Final   Special Requests Immunocompromised   Final   Gram Stain     Final   Value: RARE WBC PRESENT,BOTH PMN AND MONONUCLEAR     FEW SQUAMOUS EPITHELIAL CELLS PRESENT     NO ORGANISMS SEEN     Performed at [facility]   Culture     Final   Value: FEW METHICILLIN RESISTANT STAPHYLOCOCCUS AUREUS     Note: RIFAMPIN AND GENTAMICIN SHOULD NOT BE USED AS SINGLE DRUGS FOR TREATMENT OF STAPH INFECTIONS. This organism DOES NOT demonstrate inducible Clindamycin resistance in vitro. CRITICAL RESULT CALLED TO, READ BACK BY AND VERIFIED WITH: SHANNON G 8/25      @915  BY REAMM     Performed at [facility]   Report Status 04/24/2013 FINAL   Final   Organism ID, Bacteria METHICILLIN RESISTANT STAPHYLOCOCCUS AUREUS   Final    [x]  Treated with Cephalexin, organism resistant to prescribed antimicrobial []  Patient discharged originally without antimicrobial agent and treatment is now indicated  New antibiotic prescription: Doxycycline 100mg  PO BID x 10 days  ED Provider: Doran Durand, PA-C   Cleon Dew 04/25/2013, 10:46 AM Infectious Diseases Pharmacist Phone# [phone]

## 2013-04-26 NOTE — ED Notes (Signed)
Patient informed of positive results,Rx called to [facility] [phone].

## 2013-05-08 ENCOUNTER — Ambulatory Visit: Payer: Self-pay | Admitting: Neurology

## 2014-04-16 ENCOUNTER — Emergency Department (HOSPITAL_COMMUNITY): Payer: PRIVATE HEALTH INSURANCE

## 2014-04-16 ENCOUNTER — Encounter (HOSPITAL_COMMUNITY): Payer: Self-pay | Admitting: Emergency Medicine

## 2014-04-16 ENCOUNTER — Emergency Department (HOSPITAL_COMMUNITY)
Admission: EM | Admit: 2014-04-16 | Discharge: 2014-04-16 | Disposition: A | Discharge status: Home / Self Care | Payer: PRIVATE HEALTH INSURANCE | Attending: Emergency Medicine | Admitting: Emergency Medicine

## 2014-04-16 DIAGNOSIS — M25561 Pain in right knee: Secondary | ICD-10-CM

## 2014-04-16 DIAGNOSIS — M25569 Pain in unspecified knee: Secondary | ICD-10-CM | POA: Insufficient documentation

## 2014-04-16 DIAGNOSIS — Z21 Asymptomatic human immunodeficiency virus [HIV] infection status: Secondary | ICD-10-CM | POA: Insufficient documentation

## 2014-04-16 DIAGNOSIS — Z79899 Other long term (current) drug therapy: Secondary | ICD-10-CM | POA: Insufficient documentation

## 2014-04-16 DIAGNOSIS — Z8719 Personal history of other diseases of the digestive system: Secondary | ICD-10-CM | POA: Insufficient documentation

## 2014-04-16 DIAGNOSIS — Z792 Long term (current) use of antibiotics: Secondary | ICD-10-CM | POA: Insufficient documentation

## 2014-04-16 DIAGNOSIS — I10 Essential (primary) hypertension: Secondary | ICD-10-CM | POA: Insufficient documentation

## 2014-04-16 DIAGNOSIS — F17200 Nicotine dependence, unspecified, uncomplicated: Secondary | ICD-10-CM | POA: Insufficient documentation

## 2014-04-16 MED ORDER — LIDOCAINE HCL 1 % IJ SOLN: 5.0000 mL | Freq: Once | INTRAMUSCULAR | Status: DC

## 2014-04-16 MED ORDER — OXYCODONE-ACETAMINOPHEN 5-325 MG PO TABS: 1.0000 | ORAL_TABLET | Freq: Three times a day (TID) | ORAL | Status: DC | PRN

## 2014-04-16 MED ORDER — OXYCODONE-ACETAMINOPHEN 5-325 MG PO TABS
1.0000 | ORAL_TABLET | Freq: Once | ORAL | Status: AC
  Administered 2014-04-16: 1 via ORAL
  Filled 2014-04-16: qty 1

## 2014-04-16 NOTE — Discharge Instructions (Signed)
Please call your doctor for a followup appointment within 24-48 hours. When you talk to your doctor please let them know that you were seen in the emergency department and have them acquire all of your records so that they can discuss the findings with you and formulate a treatment plan to fully care for your new and ongoing problems. Please call and set-up an appointment with your primary care provider and Orthopedics Please keep knee in brace for comfort and when active Please avoid any physical or strenuous activity Please rest, ice, elevate - toes above nose Please take medications as prescribed - while on pain medications there is to be no drinking alcohol, driving, operating any heavy machinery. If extra please dispose in a proper manner. Please do not take any extra Tylenol with this medication for this can lead to Tylenol overdose and liver issues.  Please continue to monitor symptoms closely and if symptoms are to worsen or change (fever greater than 101, chills, sweating, nausea, vomiting, chest pain, shortness of breathe, difficulty breathing, weakness, numbness, tingling, worsening or changes to pain pattern, hot to the touch, changes to skin color, fall, injury, red streaks running up the leg) please report back to the Emergency Department immediately.    Arthralgia Your caregiver has diagnosed you as suffering from an arthralgia. Arthralgia means there is pain in a joint. This can come from many reasons including:  Bruising the joint which causes soreness (inflammation) in the joint.  Wear and tear on the joints which occur as we grow older (osteoarthritis).  Overusing the joint.  Various forms of arthritis.  Infections of the joint. Regardless of the cause of pain in your joint, most of these different pains respond to anti-inflammatory drugs and rest. The exception to this is when a joint is infected, and these cases are treated with antibiotics, if it is a bacterial  infection. HOME CARE INSTRUCTIONS   Rest the injured area for as long as directed by your caregiver. Then slowly start using the joint as directed by your caregiver and as the pain allows. Crutches as directed may be useful if the ankles, knees or hips are involved. If the knee was splinted or casted, continue use and care as directed. If an stretchy or elastic wrapping bandage has been applied today, it should be removed and re-applied every 3 to 4 hours. It should not be applied tightly, but firmly enough to keep swelling down. Watch toes and feet for swelling, bluish discoloration, coldness, numbness or excessive pain. If any of these problems (symptoms) occur, remove the ace bandage and re-apply more loosely. If these symptoms persist, contact your caregiver or return to this location.  For the first 24 hours, keep the injured extremity elevated on pillows while lying down.  Apply ice for 15-20 minutes to the sore joint every couple hours while awake for the first half day. Then 03-04 times per day for the first 48 hours. Put the ice in a plastic bag and place a towel between the bag of ice and your skin.  Wear any splinting, casting, elastic bandage applications, or slings as instructed.  Only take over-the-counter or prescription medicines for pain, discomfort, or fever as directed by your caregiver. Do not use aspirin immediately after the injury unless instructed by your physician. Aspirin can cause increased bleeding and bruising of the tissues.  If you were given crutches, continue to use them as instructed and do not resume weight bearing on the sore joint until instructed. Persistent  pain and inability to use the sore joint as directed for more than 2 to 3 days are warning signs indicating that you should see a caregiver for a follow-up visit as soon as possible. Initially, a hairline fracture (break in bone) may not be evident on X-rays. Persistent pain and swelling indicate that further  evaluation, non-weight bearing or use of the joint (use of crutches or slings as instructed), or further X-rays are indicated. X-rays may sometimes not show a small fracture until a week or 10 days later. Make a follow-up appointment with your own caregiver or one to whom we have referred you. A radiologist (specialist in reading X-rays) may read your X-rays. Make sure you know how you are to obtain your X-ray results. Do not assume everything is normal if you do not hear from [place]. SEEK MEDICAL CARE IF: Bruising, swelling, or pain increases. SEEK IMMEDIATE MEDICAL CARE IF:   Your fingers or toes are numb or blue.  The pain is not responding to medications and continues to stay the same or get worse.  The pain in your joint becomes severe.  You develop a fever over 102 F (38.9 C).  It becomes impossible to move or use the joint. MAKE SURE YOU:   Understand these instructions.  Will watch your condition.  Will get help right away if you are not doing well or get worse. Document Released: 08/17/2005 Document Revised: 11/09/2011 Document Reviewed: 04/04/2008 [facility] Patient Information 2015 [place], [place]. This information is not intended to replace advice given to you by your health care provider. Make sure you discuss any questions you have with your health care provider.   Emergency Department Resource Guide 1) Find a Doctor and Pay Out of Pocket Although you won't have to find out who is covered by your insurance plan, it is a good idea to ask around and get recommendations. You will then need to call the office and see if the doctor you have chosen will accept you as a new patient and what types of options they offer for patients who are self-pay. Some doctors offer discounts or will set up payment plans for their patients who do not have insurance, but you will need to ask so you aren't surprised when you get to your appointment.  2) Contact Your Local Health Department Not all  health departments have doctors that can see patients for sick visits, but many do, so it is worth a call to see if yours does. If you don't know where your local health department is, you can check in your phone book. The CDC also has a tool to help you locate your state's health department, and many state websites also have listings of all of their local health departments.  3) Find a Walk-in Clinic If your illness is not likely to be very severe or complicated, you may want to try a walk in clinic. These are popping up all over the country in pharmacies, drugstores, and shopping centers. They're usually staffed by nurse practitioners or physician assistants that have been trained to treat common illnesses and complaints. They're usually fairly quick and inexpensive. However, if you have serious medical issues or chronic medical problems, these are probably not your best option.  No Primary Care Doctor: - Call Health Connect at  [phone] - they can help you locate a primary care doctor that  accepts your insurance, provides certain services, etc. - Physician Referral Service- [phone]  Chronic Pain Problems: Organization  Address  Phone   Notes  [facility]  [phone] Patients need to be referred by their primary care doctor.   Medication Assistance: Organization         Address  Phone   Notes  [facility] Medication [facility] [address] [place] [place], [place] [address] [phone] --Must be a resident of [facility] -- Must have NO insurance coverage whatsoever (no Medicaid/ Medicare, etc.) -- The pt. MUST have a primary care doctor that directs their care regularly and follows them in the community   [facility]  [phone]   [employer]  [phone]    Agencies that provide inexpensive medical care: Organization         Address  Phone   Notes  [facility]  [phone]   [facility]     [phone]   [facility] [address] [place], [place] [address] [phone]   [facility] [address] [place], [place] [phone]   Planned Parenthood    [phone]   [facility]    [phone]   [facility] and [facility]  [address], [place] Phone:  [phone], Fax:  [phone] Hours of Operation:  9 am - 6 pm, M-F.  Also accepts Medicaid/Medicare and self-pay.  [facility]  [address], [place] Phone: [phone], Fax: [phone]. Hours of Operation:  8:30 am - 5:30 pm, M-F.  Also accepts Medicaid and self-pay.  [facility] [place] [address] [place] Phone: [phone]   [facility] [address] Natasha Bence [place], [place] [phone], Ext. [phone] Mondays & Thursdays: 7-9 AM.  First 15 patients are seen on a first come, first serve basis.    Medicaid-accepting [facility] Providers:  Organization         Address  Phone   Notes  [facility] [address], [place] [phone] Also accepts self-pay patients.  [facility] [address] Laurell Josephs [place], [place]  [phone]   [facility] [address], [place] [phone]   [facility] [address] [place] [phone]   Renaye Rakers [address], [place]   [phone] Only accepts [place] Access [place] patients after they have their name applied to their card.   Self-Pay (no insurance) in [facility]:  Organization         Address  Phone   Notes  Sickle Cell Patients, [facility] Internal Medicine [address] [place], [place] [phone]   [facility] [address] [place], [place] [phone]   [facility] [place]  [address], [place] [phone]    [facility]/Dr. Osei-Bonsu  [address], [place] or [address], [place] [phone] Phone number for both [place] and [place] locations is the same.  [facility] and [facility] [address], [place] [phone]   [facility] [address] [place] or [address] [phone]   [facility] [address], [place] [phone], phone; [phone], fax Sees patients 1st and 3rd Saturday of every month.  Must not qualify  for public or private insurance (i.e. Medicaid, Medicare, [place] Health Choice, Veterans' Benefits)  Household income should be no more than 200% of the poverty level The clinic cannot treat you if you are pregnant or think you are pregnant  Sexually transmitted diseases are not treated at the clinic.    Dental Care: Organization         Address  Phone  Notes  [facility] Department of [facility] [address] [place], [place] [phone] Accepts children up to age 32 who are enrolled in [place] or [place] Health Choice; pregnant women with a Medicaid card; and children who have applied for Medicaid or [place] Health Choice, but were declined, whose parents can pay a reduced fee at time of service.  [facility] of [facility]  [address], [place] [phone] Accepts children up to age 23 who are enrolled in [place] or [place] Health Choice; pregnant women with a Medicaid card; and children who have applied for Medicaid or [place] Health Choice, but were declined, whose parents can pay a reduced fee at time of service.  [facility]  [address] [place], [place] [phone] Patients are seen by appointment only. Walk-ins are not accepted. [facility] will see patients 71 years of age and older. Monday - Tuesday (8am-5pm) Most Wednesdays (8:30-5pm) $30 per visit,  cash only  [facility] Adult [facility] Access [facility]  [address], [place] [facility] [phone] Patients are seen by appointment only. Walk-ins are not accepted. [facility] will see patients 19 years of age and older. One Wednesday Evening (Monthly: Volunteer Based).  $30 per visit, cash only  [employer]  [phone] for adults; Children under age 63, call Graduate Pediatric Dentistry at [phone]. Children aged 49-14, please call [phone] to request a pediatric application.  Dental services are provided in all areas of dental care including fillings, crowns and bridges, complete and partial dentures, implants, gum treatment, root canals, and extractions. Preventive care is also provided. Treatment is provided to both adults and children. Patients are selected via a lottery and there is often a waiting list.   [facility] [address] [place]  [phone] [email or web address]   [facility] [address] [place], [place] [phone], Ext. [phone] Second and Fourth Thursday of each month, opens at 6:30 AM; Clinic ends at 9 AM.  Patients are seen on a first-come first-served basis, and a limited number are seen during each clinic.   [facility]  [address] Ether Griffins [place], [place] [phone]   Eligibility Requirements You must have lived in [place], [place], or [place] counties for at least the last three months.   You cannot be eligible for state or federal sponsored [place], including [employer], [place], or [employer].   You generally cannot be eligible for healthcare insurance through your employer.    How to apply: Eligibility screenings are held every Tuesday and Wednesday afternoon from 1:00 pm until 4:00 pm. You do not need an appointment for the interview!  [facility] [address] [place], [place] [phone]   [facility] Health Department   [phone]   [facility] Health Department  [phone]   [facility] Health Department  [phone]    Behavioral Health Resources in the Community: Intensive Outpatient Programs Organization         Address  Phone  Notes  [facility] [address], [place], [place] [phone]   [facility] Outpatient [address] [place], [place] [phone]   [facility] [address] [place], [place]  [phone]   [facility] Mental Health [address]  [place], [place] [phone] or [phone]   Substance Abuse Resources Organization         Address  Phone  Notes  [facility]  [phone]   [facility]  [phone]   [facility] [place]  [phone]   Floydene Flock  [phone]   Residential & Outpatient Substance Abuse Program  [phone]   Psychological Services Organization         Address  Phone  Notes  [facility] Behavioral Health  [phone]   [facility]  [phone]   [facility] [address], [place] [phone] or [phone]    Mobile Crisis Teams Organization         Address  Phone  Notes  [facility], Mobile Crisis Care Unit  [phone]   [facility]  [address] [place], [place] [phone]   Doristine Locks [address] [place] [phone]    Self-Help/Support Groups Organization         Address  Phone             Notes  [facility] [place] - variety of support groups  336- I7437963 Call for more information  Narcotics Anonymous (NA), [facility] [address], [employer] [place]  2 meetings at this location   Statistician         Address  Phone  Notes  [facility] [address],    [place]  [phone]   [facility]  [address] [place] [address], [place], [place] [phone]    [facility] [place] [facility] [address] [place], [place] [phone] Admissions: 8am-3pm M-F  [facility] [address],    [place], [place] [phone]   [facility] [address] [place], [place], [place] [phone]   [facility] [address],  [place], [place] [phone]   [facility] - Intensive Outpatient [address] Laurell Josephs [address], [place], [place] [phone]   [facility] ([facility] [address] [place]  [place], [place] [phone] or [phone]   [facility] ([facility]) [address] [place], [place] [phone] Accepts Medicaid  [facility] [address],  [place] [phone] Substance Abuse/Addiction Treatment   [facility] Organization         Address  Phone  Notes  [facility]  [phone]   Angie Fava, PhD [address] Ervin Knack [place], [place]   [phone] or [phone]   [facility]al   [address] [place], [place] [phone]   [facility] [address] [place], [place] [phone] Insurance/Medicaid/sponsorship through [facility] and Families [address]                                    [place], [place] [phone] Therapy/tele-psych/case  [facility] [address] [place], [place] [phone]    Dr. Lolly Mustache  [phone]   [facility] [place]  [facility]. 1) [address], [place] 2) [place] 3)  [place] [address], [place] [phone]  [phone]   [place] [phone] or [phone] (After Hours)

## 2014-04-16 NOTE — ED Notes (Signed)
Pt c/o right knee pain since July 4, denies injury

## 2014-04-16 NOTE — ED Provider Notes (Signed)
CSN: [id number]     Arrival date & time 04/16/14  1050 History   First MD Initiated Contact with Patient 04/16/14 1053     Chief Complaint  Patient presents with  . Knee Pain    right     (Consider location/radiation/quality/duration/timing/severity/associated sxs/prior Treatment) The history is provided by the patient. No language interpreter was used.  PAARTH CROPPER is a 38 y/o M with PMHx of HIV followed at [facility] - last check was May 2015 with HIV load undetectable and takes his medications daily due to follow-up this month, GERD, HTN presenting to the ED with right knee pain that has been ongoing since March 03, 2014. Patient reported that the pain is localized to the right knee described as an intermittent sharp pain without radiation. Stated that he has been wearing a knee brace, Ibuprofen, and Tylenol with minimal relief. 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 Denied injury, fall, numbness, tingling, loss of sensation, swelling, changes to skin color, ankle/foot/hip pain, fever, chills, hot to the touch.  PCP none  ID [facility]  Past Medical History  Diagnosis Date  . Shortness of breath     due to "smoking"  . HIV (human immunodeficiency virus infection)   . GERD (gastroesophageal reflux disease)   . Hypertension     sees Dr. Levada Dy [phone]  . Dysrhythmia     had echo done over 1 year ago at [facility] hosp/ "normal"   Past Surgical History  Procedure Laterality Date  . Hemorrhoid surgery    . Lumbar laminectomy  09/04/2011    Procedure: MICRODISCECTOMY LUMBAR LAMINECTOMY;  Surgeon: Kerrin Champagne, MD;  Location: [facility] OR;  Service: Orthopedics;  Laterality: N/A;  Left L5-S1 Microdiscectomy with MIS Equipment   No family history on file. History  Substance Use Topics  . Smoking status: Current Every Day Smoker -- 15 years    Types: Cigarettes  . Smokeless tobacco:  Not on file  . Alcohol Use: Yes     Comment: occassional    Review of Systems  Constitutional: Negative for fever and chills.  Musculoskeletal: Positive for arthralgias (right knee). Negative for back pain, joint swelling and myalgias.  Skin: Negative for color change.  Neurological: Negative for weakness and numbness.      Allergies  Review of patient's allergies indicates no known allergies.  Home Medications   Prior to Admission medications   Medication Sig Start Date End Date Taking? Authorizing Provider  cephALEXin (KEFLEX) 500 MG capsule Take 1 capsule (500 mg total) by mouth 4 (four) times daily. 04/21/13   Tahra Hitzeman, PA-C  efavirenz-emtrictabine-tenofovir (ATRIPLA) 600-200-300 MG per tablet Take 1 tablet by mouth daily.      Historical Provider, MD  hydrochlorothiazide (HYDRODIURIL) 25 MG tablet Take 25 mg by mouth daily.      Historical Provider, MD  Oxycodone HCl 10 MG TABS Take 10 mg by mouth every 6 (six) hours as needed (for pain).    Historical Provider, MD  oxyCODONE-acetaminophen (PERCOCET/ROXICET) 5-325 MG per tablet Take 1 tablet by mouth every 8 (eight) hours as needed for moderate pain or severe pain. 04/16/14   Laraine Samet, PA-C   BP 141/87  Pulse 69  Temp(Src) 98.1 F (36.7 C) (Oral)  Resp 18  SpO2 98% Physical Exam  Nursing note and vitals reviewed. Constitutional: He is oriented to person,  place, and time. He appears well-developed and well-nourished. No distress.  HENT:  Head: Normocephalic and atraumatic.  Eyes: Conjunctivae and EOM are normal. Pupils are equal, round, and reactive to light. Right eye exhibits no discharge. Left eye exhibits no discharge.  Neck: Normal range of motion. Neck supple.  Cardiovascular: Normal rate, regular rhythm and normal heart sounds.  Exam reveals no friction rub.   No murmur heard. Pulses:      Radial pulses are 2+ on the right side, and 2+ on the left side.       Dorsalis pedis pulses are 2+ on the left  side.       Posterior tibial pulses are 2+ on the right side, and 2+ on the left side.  Cap refill < 3 seconds  Pulmonary/Chest: Effort normal and breath sounds normal. No respiratory distress. He has no wheezes. He has no rales.  Musculoskeletal: Normal range of motion.       Right knee: He exhibits normal range of motion, no swelling, no effusion, no ecchymosis, no deformity, no laceration and no erythema. Tenderness found. Medial joint line and lateral joint line tenderness noted.       Legs: Negative swelling, erythema, inflammation, lesions, sores, deformities, malalignments noted to the right knee. Negative warmth upon palpation. Negative red streaks. Discomfort upon palpation to the right knee circumferential. Negative anterior posterior drawer sign. Negative valgus varus tension. Full flexion and extension of the right knee-crepitus identified. Full range of motion identified without difficulty or ataxia. Full range of motion to the right ankle and digits of the right foot.  Neurological: He is alert and oriented to person, place, and time. No cranial nerve deficit. He exhibits normal muscle tone. Coordination normal.  Cranial nerves III-XII grossly intact Strength 5+/5+ to lower extremities bilaterally with resistance applied, equal distribution noted Strength intact to digits of the right foot Sensation intact with differentiation sharp and dull touch Heel to knee down shin normal bilaterally Gait proper, proper balance - negative sway, negative drift, negative step-offs  Skin: Skin is warm and dry. No rash noted. He is not diaphoretic. No erythema.  Psychiatric: He has a normal mood and affect. His behavior is normal. Thought content normal.    ED Course  Procedures (including critical care time)  Procedure performed by: Emilia BeckSZEKALSKI, KAITLYN, PA-C After consent was obtained, using sterile technique the RIGHT KNEE was prepped and plain Lidocaine 2% was used as local anesthetic. The  joint was entered USING AN 18 G NEEDLE AND 60 ML SYRINGE and 0 ml's of colored fluid was withdrawn. The procedure was well tolerated.  The patient is asked to continue to rest the joint for a few more days before resuming regular activities.  It may be more painful for the first 1-2 days.  Watch for fever, or increased swelling or persistent pain in the joint. Call or return to clinic prn if such symptoms occur or there is failure to improve as anticipated.   Labs Review Labs Reviewed - No data to display  Imaging Review Dg Knee Complete 4 Views Right  04/16/2014   CLINICAL DATA:  Chronic increasing RIGHT knee pain for 6 weeks without injury.  EXAM: RIGHT KNEE - COMPLETE 4+ VIEW  COMPARISON:  None.  FINDINGS: There is no evidence of fracture, or dislocation. No appreciable joint space narrowing is noted. There is a moderate joint effusion.  IMPRESSION: Moderate effusion without fracture or osseous lesion. No significant joint space narrowing.   Electronically Signed  By: Davonna Belling M.D.   On: 04/16/2014 12:00     EKG Interpretation None      MDM   Final diagnoses:  Right knee pain    Medications  lidocaine (XYLOCAINE) 1 % (with pres) injection 5 mL (not administered)  oxyCODONE-acetaminophen (PERCOCET/ROXICET) 5-325 MG per tablet 1 tablet (1 tablet Oral Given 04/16/14 1303)    Filed Vitals:   04/16/14 1102  BP: 141/87  Pulse: 69  Temp: 98.1 F (36.7 C)  TempSrc: Oral  Resp: 18  SpO2: 98%   Plain film of right knee negative for acute osseous injury. Moderate joint effusion without fracture noted. Discussed case with attending physician, Dr. Genene Churn. Aspiration of the joint performed without success.  Doubt septic joint. Doubt gout. Suspicion to be possible arthritis. Negative focal neurological deficits noted. Gait proper. Negative red flags. Patient stable, afebrile. Patient not septic appearing. Discharged patient. Referred to orthopedics. Discussed with patient to continue  taking HIV medication and to follow up with appointment at the end of this month. Discussed with patient to rest, ice, elevate. Patient placed in knee brace for comfort. Discussed with patient to closely monitor symptoms and if symptoms are to worsen or change to report back to the ED - strict return instructions given.  Patient agreed to plan of care, understood, all questions answered.   Raymon Mutton, PA-C 04/16/14 1314

## 2014-04-17 NOTE — ED Provider Notes (Signed)
Medical screening examination/treatment/procedure(s) were performed by non-physician practitioner and as supervising physician I was immediately available for consultation/collaboration.   EKG Interpretation None        Genecis Veley, MD 04/17/14 0654 

## 2015-07-18 ENCOUNTER — Emergency Department (HOSPITAL_COMMUNITY)
Admission: EM | Admit: 2015-07-18 | Discharge: 2015-07-18 | Disposition: A | Discharge status: Home / Self Care | Payer: PRIVATE HEALTH INSURANCE | Attending: Emergency Medicine | Admitting: Emergency Medicine

## 2015-07-18 ENCOUNTER — Encounter (HOSPITAL_COMMUNITY): Payer: Self-pay

## 2015-07-18 DIAGNOSIS — F17210 Nicotine dependence, cigarettes, uncomplicated: Secondary | ICD-10-CM | POA: Insufficient documentation

## 2015-07-18 DIAGNOSIS — Z79899 Other long term (current) drug therapy: Secondary | ICD-10-CM | POA: Insufficient documentation

## 2015-07-18 DIAGNOSIS — K0889 Other specified disorders of teeth and supporting structures: Secondary | ICD-10-CM

## 2015-07-18 DIAGNOSIS — K002 Abnormalities of size and form of teeth: Secondary | ICD-10-CM | POA: Insufficient documentation

## 2015-07-18 DIAGNOSIS — I10 Essential (primary) hypertension: Secondary | ICD-10-CM | POA: Insufficient documentation

## 2015-07-18 DIAGNOSIS — K029 Dental caries, unspecified: Secondary | ICD-10-CM | POA: Insufficient documentation

## 2015-07-18 DIAGNOSIS — B20 Human immunodeficiency virus [HIV] disease: Secondary | ICD-10-CM | POA: Insufficient documentation

## 2015-07-18 DIAGNOSIS — M79602 Pain in left arm: Secondary | ICD-10-CM | POA: Insufficient documentation

## 2015-07-18 NOTE — Discharge Instructions (Signed)
Dental Pain  ° ° °Dental pain may be caused by many things, including:  °Tooth decay (cavities or caries). Cavities expose the nerve of your tooth to air and hot or cold temperatures. This can cause pain or discomfort.  °Abscess or infection. A dental abscess is a collection of infected pus from a bacterial infection in the inner part of the tooth (pulp). It usually occurs at the end of the tooth's root.  °Injury.  °An unknown reason (idiopathic). °Your pain may be mild or severe. It may only occur when:  °You are chewing.  °You are exposed to hot or cold temperature.  °You are eating or drinking sugary foods or beverages, such as soda or candy. °Your pain may also be constant.  °HOME CARE INSTRUCTIONS  °Watch your dental pain for any changes. The following actions may help to lessen any discomfort that you are feeling:  °Take medicines only as directed by your dentist.  °If you were prescribed an antibiotic medicine, finish all of it even if you start to feel better.  °Keep all follow-up visits as directed by your dentist. This is important.  °Do not apply heat to the outside of your face.  °Rinse your mouth or gargle with salt water if directed by your dentist. This helps with pain and swelling.  °You can make salt water by adding ¼ tsp of salt to 1 cup of warm water. °Apply ice to the painful area of your face:  °Put ice in a plastic bag.  °Place a towel between your skin and the bag.  °Leave the ice on for 20 minutes, 2-3 times per day. °Avoid foods or drinks that cause you pain, such as:  °Very hot or very cold foods or drinks.  °Sweet or sugary foods or drinks. °SEEK MEDICAL CARE IF:  °Your pain is not controlled with medicines.  °Your symptoms are worse.  °You have new symptoms. °SEEK IMMEDIATE MEDICAL CARE IF:  °You are unable to open your mouth.  °You are having trouble breathing or swallowing.  °You have a fever.  °Your face, neck, or jaw is swollen. °This information is not intended to replace advice  given to you by your health care provider. Make sure you discuss any questions you have with your health care provider.  °Document Released: 08/17/2005 Document Revised: 01/01/2015 Document Reviewed: 08/13/2014  °Elsevier Interactive Patient Education ©2016 Elsevier Inc.  ° ° °Emergency Department Resource Guide °1) Find a Doctor and Pay Out of Pocket °Although you won't have to find out who is covered by your insurance plan, it is a good idea to ask around and get recommendations. You will then need to call the office and see if the doctor you have chosen will accept you as a new patient and what types of options they offer for patients who are self-pay. Some doctors offer discounts or will set up payment plans for their patients who do not have insurance, but you will need to ask so you aren't surprised when you get to your appointment. ° °2) Contact Your Local Health Department °Not all health departments have doctors that can see patients for sick visits, but many do, so it is worth a call to see if yours does. If you don't know where your local health department is, you can check in your phone book. The CDC also has a tool to help you locate your state's health department, and many state websites also have listings of all of their local health departments. ° °  3) Find a Walk-in Clinic °If your illness is not likely to be very severe or complicated, you may want to try a walk in clinic. These are popping up all over the country in pharmacies, drugstores, and shopping centers. They're usually staffed by nurse practitioners or physician assistants that have been trained to treat common illnesses and complaints. They're usually fairly quick and inexpensive. However, if you have serious medical issues or chronic medical problems, these are probably not your best option. ° °No Primary Care Doctor: °- Call Health Connect at  832-8000 - they can help you locate a primary care doctor that  accepts your insurance,  provides certain services, etc. °- Physician Referral Service- 1-800-533-3463 ° °Chronic Pain Problems: °Organization         Address  Phone   Notes  °[facility]  (336) 297-2271 Patients need to be referred by their primary care doctor.  ° °Medication Assistance: °Organization         Address  Phone   Notes  °[place] [facility] [address] °[place], [place] [address] (336) 641-8030 --Must be a resident of [place] °-- Must have NO insurance coverage whatsoever (no Medicaid/ Medicare, etc.) °-- The pt. MUST have a primary care doctor that directs their care regularly and follows them in the community °  °[facility]  (866) 331-1348   °[employer]  (888) 892-1162   ° °Agencies that provide inexpensive medical care: °Organization         Address  Phone   Notes  °[facility]  (336) 832-8035   °[facility]    (336) 832-7272   °[facility] [address] °[place], [place] [address] (336) 832-4777   °[facility] [address], °[place] (336) 271-4999   °[facility]    (336) 373-0678   °[facility]    (336) 272-1050   °[facility] ° [address], [place] Phone:  (336) 832-4444, Fax:  (336) 832-4440 Hours of Operation:  9 am - 6 pm, M-F.  Also accepts Medicaid/Medicare and self-pay.  °[facility] ° [address], [place] Phone: (336) 832-3150, Fax: (336) 832-3151. Hours of Operation:  8:30 am - 5:30 pm, M-F.  Also accepts Medicaid and self-pay.  °[facility] [place] [address], [place] Phone: (336) 878-6027   °[facility] [address], [place], [place] (336)723-1848, Ext. 123 Mondays & Thursdays: 7-9 AM.  First 15 patients are seen on a first come, first serve basis. °  ° °Medicaid-accepting [place] Providers: ° °Organization         Address  Phone    Notes  °[facility] [address], [place] (336) 641-2100 Also accepts self-pay patients.  °[facility] [address], [place] ° (336) 856-9996   °[facility] [address], [place] (336) 288-8857   °[facility] [address] [place] [address], [place] (336) 299-7000   °Veita Bland [address],   ° (336) 373-1557 Only accepts [place] Access Medicaid patients after they have their name applied to their card.  ° °Self-Pay (no insurance) in [place]: ° °Organization         Address  Phone   Notes  °Sickle Cell Patients, [facility] [address], [place] (336) 832-1970   °[facility] [address], [place] (336) 832-4400   °[facility] [place] ° [address], [place] (336) 992-4800   °[facility]/Dr. Osei-Bonsu ° [address] [place] [address], [place] or [address], [place] (336) 841-8500 Phone number for both [place] and [place] locations is the same.  °[facility] [address], [place] (336) 299-0000   °[facility] [address] [place] [address], [place] or [address] (336) 852-7530 °(336) 878-2260   °[facility] [address], [place] (336) 350-1642, phone; (336) 294-5005, fax Sees patients 1st and 3rd Saturday of every month.  Must not qualify for public or private insurance (i.e. Medicaid, Medicare, [place] Health Choice, Veterans' Benefits) • Household income should be no more than 200% of the poverty level •The clinic cannot treat you if you are pregnant or think you are pregnant • Sexually transmitted diseases are not treated at the clinic.  ° ° °Dental Care: °Organization         Address  Phone  Notes  °[facility] [facility] [address], [place] (336)  641-6152 Accepts children up to age 21 who are enrolled in Medicaid or [place] Health Choice; pregnant women with a Medicaid card; and children who have applied for Medicaid or [place] Health Choice, but were declined, whose parents can pay a reduced fee at time of service.  °[facility] [place]  [address], [place] (336) 641-7733 Accepts children up to age 21 who are enrolled in Medicaid or [place] Health Choice; pregnant women with a Medicaid card; and children who have applied for Medicaid or [place] Health Choice, but were declined, whose parents can pay a reduced fee at time of service.  °[facility] ° [address], [place] (336) 641-4533 Patients are seen by appointment only. Walk-ins are not accepted. [facility] will see patients 18 years of age and older. °Monday - Tuesday (8am-5pm) °Most Wednesdays (8:30-5pm) °$30 per visit, cash only  °[facility] ° [address], [place] (336) 641-4533 Patients are seen by appointment only. Walk-ins are not accepted. [facility] will see patients 18 years of age and older. °One Wednesday Evening (Monthly: Volunteer Based).  $30 per visit, cash only  °[facility]  (919) 537-3737 for adults; Children under age 4, call Graduate Pediatric Dentistry at (919) [phone]. Children aged 4-14, please call (919) [phone] to request a pediatric application. ° Dental services are provided in all areas of dental care including fillings, crowns and bridges, complete and partial dentures, implants, gum treatment, root canals, and extractions. Preventive care is also provided. Treatment is provided to both adults and children. °Patients are selected via a lottery and there is often a waiting list. °  °[facility] [address], °[place] ° (336) 763-8833 [email or web address].[email or web address] °  °[facility] [address], [place],  (336)723-1848, Ext.  123 Second and Fourth Thursday of each month, opens at 6:30 AM; Clinic ends at 9 AM.  Patients are seen on a first-come first-served basis, and a limited number are seen during each clinic.  ° °[facility] ° [address], [place],  (336) 723-7904   Eligibility Requirements °You must have lived in [place],   [place], or [place] counties for at least the last three months. °  You cannot be eligible for state or federal sponsored healthcare insurance, including Veterans Administration, Medicaid, or Medicare. °  You generally cannot be eligible for healthcare insurance through your employer.  °  How to apply: °Eligibility screenings are held every Tuesday and Wednesday afternoon from 1:00 pm until 4:00 pm. You do not need an appointment for the interview!  °[facility] [address], [place], [place] 336-631-2330   °[facility] [facility]  336-342-8273   °[facility]  336-703-3100   °[facility]  336-570-6415   ° °Behavioral Health Resources in the Community: °Intensive Outpatient Programs °Organization         Address  Phone  Notes  °[facility] [address], [place], [place] 336-878-6098   °[facility] [address], [place], [place] 336-832-9800   °[facility] [address], [place], [place] ° 336-882-2125   °[facility] [address],  °[place], [place] 1-800-853-5163 or 336-641-4981   °Substance Abuse Resources °Organization         Address  Phone  Notes  °[facility]  336-882-2125   °[facility]  336-784-9470   °[facility]  336-285-9073   °[facility]  336-845-3988   °Residential & Outpatient Substance Abuse Program  1-800-659-3381   °Psychological Services °Organization         Address  Phone  Notes  °[facility]  336- 832-9600   °[facility]  336- 378-7881   °[place] [facility] [address], [place] 1-800-853-5163 or 336-641-4981   ° °Mobile Crisis Teams °Organization         Address  Phone  Notes  °[facility], Mobile Crisis Care Unit  1-877-626-1772   °Assertive °[facility] ° [address] [place], [place] 336-834-9664   °Sharon DeEsch [address] °[place] 336-554-5454   ° °Self-Help/Support Groups °Organization         Address  Phone             Notes  °[facility] [place] - variety of support groups  336- 373-1402 Call for more information  °Narcotics Anonymous (NA), [facility] [address], °[place]  2 meetings at this location  ° °Residential Treatment Programs °Organization         Address  Phone  Notes  °[facility] [address],    °[place]  1-866-801-8205   °[facility] ° [address], [place], [place] 704-293-8524   °[facility] [address], [place] 336-845-3988 Admissions: 8am-3pm M-F  °[facility] [address]    °[place], [place] 336-841-1104   °[facility] [address], [place], [place] 336-379-7146   °[facility] [address]  °[place], [place] 336-285-9073   °[facility] Intensive Outpatient [address], [place], [place] 336-852-3033   °[facility] [address]  °[place], [place] 1-877-615-2722 or 336-784-9470   °[facility] ([facility] [address] [place], [place] 336-227-7417 Accepts Medicaid  °[facility] [address]  ° [place] 1-800-659-3381 Substance Abuse/Addiction Treatment  ° °[place] Behavioral Health Resources °Organization         Address  Phone  Notes  °[facility]  (888) 581-9988   °Julie Brannon, PhD [address] [place], [place]   (336) 349-5553 or (336) 951-0000   °[facility]   [address] °[place], [place] (336) 349-4454   °[facility] [address], [place], [place] (336) 342-8316 Insurance/Medicaid/sponsorship through [facility]  °[facility] [address]                                    [place], [place] (336) 342-8316 Therapy/tele-psych/case  °[facility] [address]  ° [place], [place] (336) 349-2233    °Dr. Arfeen  (336) 349-4544   °[facility] [place]  [employer] [place] [facility] 1) [address], [place] °2) [address], [place] °3)  [address], [place] (336) 349-3220 °(336) 342-7768 ° °(336) 342-8140   °[place] Child Abuse [facility] (336) 342-1394 or (336) 342-3537 (After Hours)    ° ° ° °

## 2015-07-18 NOTE — ED Notes (Signed)
See PA summary for further assessment. 

## 2015-07-18 NOTE — ED Notes (Signed)
Patient is alert and orientedx4.  Patient was explained discharge instructions and they understood them with no questions.   

## 2015-07-18 NOTE — ED Notes (Signed)
Pt here with c/o of dental pain (upper right and left teeth), onset 2 months ago. [facility] but was told he couldn't get a root canal due to lack of insurance. Pt also reports a pain to left arm, he states there is a vein to left anterior forearm [facility] and states it is swollen. The swelling and pain went away 3 weeks ago but came back 3 days ago. No redness noted at site.

## 2015-07-18 NOTE — ED Provider Notes (Signed)
CSN: [id number]     Arrival date & time 07/18/15  1608 History  By signing my name below, I, Budd PalmerVanessa Prueter, attest that this documentation has been prepared under the direction and in the presence of Bear StearnsKayla Asaad Gulley, PA-C. Electronically Signed: Budd PalmerVanessa Prueter, ED Scribe. 07/18/2015. 5:18 PM.     Chief Complaint  Patient presents with  . Dental Pain  . Arm Pain   The history is provided by the patient. No language interpreter was used.   HPI Comments: Susy ManorDaniel R Magallanes is a 39 y.o. male smoker with a PMHx of HIV and HTN who presents to the Emergency Department complaining of intermittent, aching, bilateral upper dental pain onset 2 months ago. He notes exacerbation of the pain with eating and drinking. He states he has recently had fillings put in at [facility], at which time he was also given antibiotics. He states that it was also recommended he get a root canal, but that he did not have insurance. Pt denies fever, facial swelling or pain, drainage, drooling, and neck pain.  He was told the teeth would ultimately need to come out, but he refused at that time.  He states that he now feels ready for the teeth to be pulled and would like a dental referral.  He also c/o left forearm pain onset 3 weeks ago that initially resolved, but returned again 3 days ago. He states the arm has a large vein in it that will intermittently swell up, turn bright blue and painful, then go back to normal. He also states that the pain leaves him unable to bend the arm. He reports taking 800 mg ibuprofen with sufficient relief during the day, but states that he wakes up in the middle of the night from the pain, and that the arm is then swollen in the morning if he sleeps on his left side. He denies IV drug use. Pt denies hand discoloration, pruritis, arm swelling, numbness, or tingling. He also denies erythema, shoulder pain, SOB, and CP.  Past Medical History  Diagnosis Date  . Shortness of breath     due to "smoking"  . HIV  (human immunodeficiency virus infection) (HCC)   . GERD (gastroesophageal reflux disease)   . Hypertension     sees Dr. Levada DyBoroso [phone]  . Dysrhythmia     had [facility] over 1 year ago [facility]/ "normal"   Past Surgical History  Procedure Laterality Date  . Hemorrhoid surgery    . Lumbar laminectomy  09/04/2011    Procedure: MICRODISCECTOMY LUMBAR LAMINECTOMY;  Surgeon: Kerrin ChampagneJames E Nitka, MD;  [facility] OR;  Service: Orthopedics;  Laterality: N/A;  Left L5-S1 Microdiscectomy with MIS Equipment   No family history on file. Social History  Substance Use Topics  . Smoking status: Current Every Day Smoker -- 15 years    Types: Cigarettes  . Smokeless tobacco: None  . Alcohol Use: Yes     Comment: occassional    Review of Systems A complete 10 system review of systems was obtained and all systems are negative except as noted in the HPI and PMH.    Allergies  Tramadol  Home Medications   Prior to Admission medications   Medication Sig Start Date End Date Taking? Authorizing Provider  efavirenz-emtrictabine-tenofovir (ATRIPLA) 600-200-300 MG per tablet Take 1 tablet by mouth daily.      Historical Provider, MD  hydrochlorothiazide (HYDRODIURIL) 25 MG tablet Take 25 mg by mouth daily.      Historical Provider, MD  ibuprofen (ADVIL,MOTRIN) 200 MG tablet Take 200 mg by mouth every 6 (six) hours as needed for moderate pain.    Historical Provider, MD  oxyCODONE-acetaminophen (PERCOCET/ROXICET) 5-325 MG per tablet Take 1 tablet by mouth every 8 (eight) hours as needed for moderate pain or severe pain. 04/16/14   Marissa Sciacca, PA-C   BP 120/92 mmHg  Pulse 88  Temp(Src) 98.7 F (37.1 C) (Oral)  Resp 16  Ht  (1.803 m)  Wt 201 lb (91.173 kg)  BMI 28.05 kg/m2  SpO2 96% Physical Exam  Constitutional: He is oriented to person, place, and time. He appears well-developed and well-nourished.  HENT:  Head: Normocephalic and atraumatic.  Right Ear: External ear normal.   Left Ear: External ear normal.  Nose: Nose normal.  Mouth/Throat: Uvula is midline, oropharynx is clear and moist and mucous membranes are normal. No oral lesions. No trismus in the jaw. Abnormal dentition. Dental caries present. No dental abscesses or uvula swelling.  Eyes: Conjunctivae are normal. Right eye exhibits no discharge. Left eye exhibits no discharge.  Neck: Normal range of motion. Neck supple.  No Ludwig angina  Cardiovascular: Normal rate, regular rhythm and normal heart sounds.   Pulses:      Radial pulses are 2+ on the right side, and 2+ on the left side.  Negative Adson's test  Pulmonary/Chest: Effort normal and breath sounds normal. No respiratory distress. He has no wheezes. He has no rales.  Abdominal: Soft. Bowel sounds are normal. He exhibits no distension.  Musculoskeletal: Normal range of motion.  FAROM of upper extremities bilaterally to include shoulder, elbow, wrist, and digits.  Lymphadenopathy:    He has no cervical adenopathy.  Neurological: He is alert and oriented to person, place, and time. Coordination normal.  Strength and sensation intact bilaterally in upper extremities.  Skin: Skin is warm and dry. No rash noted. He is not diaphoretic. No erythema.  Left upper extremity has NO edema, swelling, erythema, induration, fluctuance, abnormal temperature, abnormal color.  AC veins nontender to palpation and without erythema or swelling.  Psychiatric: He has a normal mood and affect.  Nursing note and vitals reviewed.   ED Course  Procedures  DIAGNOSTIC STUDIES: Oxygen Saturation is 96% on RA, adequate by my interpretation.    COORDINATION OF CARE: 5:15 PM - Discussed plans to consult with another PA. Pt advised of plan for treatment and pt agrees.  Labs Review Labs Reviewed - No data to display  Imaging Review No results found. I have personally reviewed and evaluated these images and lab results as part of my medical decision-making.   EKG  Interpretation None      MDM   Final diagnoses:  Pain, dental  Left arm pain    VSS, NAD.  Patient presents with chronic dental pain.  No fevers, facial swelling, neck swelling, difficulty tolerating secretions, foul odor, discharge, or halitosis.  Doubt abscess.  Suspect dental pain secondary to poor dentition.  NO indication for abx at this time.  Patient presents with left upper arm pain.  No swelling, edema, erythema, or tenderness.  2+ radial pulses.  Sensation and strength intact.  FAROM.  Doubt thoracic outlet syndrome.  Doubt arterial occulsion.  Doubt venous occlusion.  Doubt lymphangitis, thrombophlebitis, or cellulitis.  VSS, NAD.   Evaluation does not show pathology requring ongoing emergent intervention or admission. Pt is hemodynamically stable and mentating appropriately. Discussed findings/results and plan with patient/guardian, who agrees with plan. All questions answered. Return precautions discussed and  outpatient follow up given.   I personally performed the services described in this documentation, which was scribed in my presence. The recorded information has been reviewed and is accurate.   Cheri Fowler, PA-C 07/18/15 1737  Rolland Porter, MD 07/25/15 [phone]

## 2018-06-06 ENCOUNTER — Ambulatory Visit (INDEPENDENT_AMBULATORY_CARE_PROVIDER_SITE_OTHER): Payer: Self-pay

## 2018-06-06 ENCOUNTER — Encounter (INDEPENDENT_AMBULATORY_CARE_PROVIDER_SITE_OTHER): Payer: Self-pay | Admitting: Family Medicine

## 2018-06-06 ENCOUNTER — Ambulatory Visit (INDEPENDENT_AMBULATORY_CARE_PROVIDER_SITE_OTHER): Payer: Self-pay | Admitting: Family Medicine

## 2018-06-06 DIAGNOSIS — M545 Low back pain, unspecified: Secondary | ICD-10-CM

## 2018-06-06 DIAGNOSIS — M25552 Pain in left hip: Secondary | ICD-10-CM

## 2018-06-06 MED ORDER — ETODOLAC 400 MG PO TABS: 400.0000 mg | ORAL_TABLET | Freq: Two times a day (BID) | ORAL | 3 refills | Status: DC | PRN

## 2018-06-06 MED ORDER — TIZANIDINE HCL 2 MG PO TABS: 2.0000 mg | ORAL_TABLET | Freq: Four times a day (QID) | ORAL | 1 refills | Status: DC | PRN

## 2018-06-06 NOTE — Progress Notes (Signed)
Office Visit Note   Patient: Jerry Scott           Date of Birth: [birth year]           MRN: [id number] Visit Date: 06/06/2018 Requested by: No referring provider defined for this encounter. PCP: System, Provider Not In  Subjective: Chief Complaint  Patient presents with  . Lower Back - Pain    Patient states LBP and Left leg pain started 1 month ago. NKI. States he started a new job 1 month ago at [employer]. Has numbness and sharp pains in leg. Had SU 7 years ago with Dr Otelia Sergeant.   . Left Leg - Pain    HPI: He is here with low back and left hip pain.  He has a history of lumbar discectomy in 2013.  He did pretty well after that with intermittent pain in his low back and left posterior hip.  Recently he started working at [employer] and his job is much more physically demanding than he is used to.  He started noticing pain which has gotten steadily worse.  It is very similar to the pain he had prior to his surgery.  Pain does not go below the knee, no bowel or bladder dysfunction.  No numbness or weakness in his legs.              ROS: Otherwise noncontributory  Objective: Vital Signs: There were no vitals taken for this visit.  Physical Exam:  Back: He is tender primarily near the left sacroiliac joint.  Midline well-healed surgical scar, nontender in that area.  No pain in the sciatic notch or of the greater trochanter.  Straight leg raise negative, lower extremity strength and reflexes are normal. Left hip: Very limited range of motion with passive internal and external rotation and this causes quite a bit of pain.  Imaging: Lumbar spine x-rays: Mild degenerative disc disease at the lower levels.  Lumbar scoliosis with convex left.  Mild sacroiliac dysfunction, and a moderate leg length discrepancy left shorter than right.  Left hip x-rays: Still has good joint space but he does have periarticular spurring consistent with mild to moderate arthritis.   Assessment & Plan: 1.  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  Anti-inflammatory and muscle relaxant as needed.  Physical therapy referral.  Okay to continue working as pain permits. -Lumbar MRI scan if symptoms persist.  If no indication for surgery, then possibly SI joint injection or trial of chiropractic.   Follow-Up Instructions: No follow-ups on file.       Procedures: None today   PMFS History: Patient Active Problem List   Diagnosis Date Noted  . Herniated nucleus pulposus, lumbar 09/04/2011    Class: Acute  . Spinal stenosis of lumbar region with neurogenic claudication 09/04/2011    Class: Diagnosis of   Past Medical History:  Diagnosis Date  . Dysrhythmia    had echo done over 1 year ago at [facility] hosp/ "normal"  . GERD (gastroesophageal reflux disease)   . HIV (human immunodeficiency virus infection) (HCC)   . Hypertension    sees Dr. Levada Dy [phone]  . Shortness of breath    due to "smoking"    History reviewed. No pertinent family history.  Past Surgical History:  Procedure Laterality Date  . HEMORRHOID SURGERY    . LUMBAR LAMINECTOMY  09/04/2011   Procedure: MICRODISCECTOMY LUMBAR LAMINECTOMY;  Surgeon:  Kerrin Champagne, MD;  Location: [facility] OR;  Service: Orthopedics;  Laterality: N/A;  Left L5-S1 Microdiscectomy with MIS Equipment   Social History   Occupational History  . Not on file  Tobacco Use  . Smoking status: Current Every Day Smoker    Years: 15.00    Types: Cigarettes  Substance and Sexual Activity  . Alcohol use: Yes    Comment: occassional  . Drug use: Yes    Types: Marijuana  . Sexual activity: Yes

## 2018-06-10 ENCOUNTER — Telehealth (INDEPENDENT_AMBULATORY_CARE_PROVIDER_SITE_OTHER): Payer: Self-pay | Admitting: Family Medicine

## 2018-06-10 ENCOUNTER — Encounter (INDEPENDENT_AMBULATORY_CARE_PROVIDER_SITE_OTHER): Payer: Self-pay

## 2018-06-10 NOTE — Telephone Encounter (Signed)
Patient called would like to speak with Dr.Hilts pertaining to work note he stated his work note is incorrect. Note needs to specify exactly what his restrictions are.

## 2018-06-10 NOTE — Telephone Encounter (Signed)
The patient is requesting a new work note that states he can return to work on 06/10/18 (today) with the restrictions of no lifting/pushing/pulling greater than 50 lbs, at least until he can see the physical therapist.  He is scheduled currently for PT at the [facility] facility on [address] on 10/22, but he is going to call them to see if he can be seen sooner. He has no followup appointment here scheduled.  Please advise (if the note is ok, I can write this and stamp your signature).

## 2018-06-10 NOTE — Telephone Encounter (Signed)
Dr. Prince Rome gave consent by phone for writing a new work note for the patient.  I advised the patient it will be waiting at the front desk for him to pick up this afternoon.

## 2018-06-21 ENCOUNTER — Other Ambulatory Visit: Payer: Self-pay

## 2018-06-21 ENCOUNTER — Ambulatory Visit: Payer: Medicaid Other | Attending: Family Medicine | Admitting: Physical Therapy

## 2018-06-21 DIAGNOSIS — R293 Abnormal posture: Secondary | ICD-10-CM | POA: Insufficient documentation

## 2018-06-21 DIAGNOSIS — M25552 Pain in left hip: Secondary | ICD-10-CM | POA: Insufficient documentation

## 2018-06-21 DIAGNOSIS — M5440 Lumbago with sciatica, unspecified side: Secondary | ICD-10-CM

## 2018-06-21 DIAGNOSIS — M6281 Muscle weakness (generalized): Secondary | ICD-10-CM | POA: Insufficient documentation

## 2018-06-21 DIAGNOSIS — M25652 Stiffness of left hip, not elsewhere classified: Secondary | ICD-10-CM | POA: Insufficient documentation

## 2018-06-21 NOTE — Therapy (Addendum)
[facility] [address] [place], [place], [address] Phone: [phone]   Fax:  [phone]  Physical Therapy Treatment  Patient Details  Name: Jerry Scott MRN: [id number] Date of Birth: 04/08/1976 Referring Provider (PT): Lavada Mesi MD   Encounter Date: 06/21/2018  PT End of Session - 06/21/18 0821    Visit Number  1    Number of Visits  12   Date for PT Re-Evaluation  07/12/18    Authorization Type  self pay applied for CAFA     PT Start Time  0808    PT Stop Time  0845    PT Time Calculation (min)  37 min    Activity Tolerance  Patient tolerated treatment well    Behavior During Therapy  [facility] for tasks assessed/performed       Past Medical History:  Diagnosis Date  . Dysrhythmia    had echo done over 1 year ago at [facility] hosp/ "normal"  . GERD (gastroesophageal reflux disease)   . HIV (human immunodeficiency virus infection) (HCC)   . Hypertension    sees Dr. Levada Dy [phone]  . Shortness of breath    due to "smoking"    Past Surgical History:  Procedure Laterality Date  . HEMORRHOID SURGERY    . LUMBAR LAMINECTOMY  09/04/2011   Procedure: MICRODISCECTOMY LUMBAR LAMINECTOMY;  Surgeon: Kerrin Champagne, MD;  Location: [facility] OR;  Service: Orthopedics;  Laterality: N/A;  Left L5-S1 Microdiscectomy with MIS Equipment    There were no vitals filed for this visit.  Subjective Assessment - 06/21/18 0810    Subjective  I started having back pain one week after starting to work at [employer]. I load trucks.  This started a week after Labor Day 2019.  I was lifting and stacking boxes and my left knee gave out and I fell to my knees  I had an back surgery on left in 2013    Pertinent History  HIV, GERD, dysrhymia 1 year ago, SOB , HTN, No lifting over 50 lbs by MD  back surgery in 2013 bulging disc    Limitations  Walking;Standing;Sitting    How long can you sit comfortably?  I must sit forward to be comfortable.  5 min max     How long can you stand comfortably?  standing a good 30 minutes before left leg start hurting to lateral lower leg    How long can you walk comfortably?  walking only 15 minutes    Diagnostic tests  x ray  OA in left hip     Patient Stated Goals  Get back to work normally  I am limited ot 50 lb now    Currently in Pain?  Yes    Pain Score  6     Pain Location  Back    Pain Orientation  Right;Left;Mid    Pain Descriptors / Indicators  Aching;Burning;Sharp    Pain Radiating Towards  toward left lateral leg    Pain Onset  1 to 4 weeks ago   1 month   Pain Frequency  Constant    Aggravating Factors   lifting boxes and stacking walking and standing    Pain Relieving Factors  Meds    Multiple Pain Sites  Yes    Pain Score  8    Pain Location  Hip    Pain Orientation  Left    Pain Descriptors / Indicators  Aching;Burning;Sharp    Pain Type  Acute pain  Pain Onset  More than a month ago    Pain Frequency  Constant    Aggravating Factors   walking , standing and sleeping         [facility] PT Assessment - 06/21/18 0823      Assessment   Medical Diagnosis  low back pain    Referring Provider (PT)  Hilts, Michael MD    Onset Date/Surgical Date  05/09/18    Hand Dominance  Right    Prior Therapy  none      Precautions   Precautions  Back    Precaution Comments  no lifting greater than 50 lb      Balance Screen   Has the patient fallen in the past 6 months  Yes   fell at work when knee gave out?   How many times?  1    Has the patient had a decrease in activity level because of a fear of falling?   Yes   fearful of knee weakness   Is the patient reluctant to leave their home because of a fear of falling?   No      Home Environment   Living Environment  Private residence    Living Arrangements  Spouse/significant other    Type of Home  Apartment    Home Access  Level entry    Home Layout  One level      Prior Function   Level of Independence  Independent      Cognition    Overall Cognitive Status  Within Functional Limits for tasks assessed      Observation/Other Assessments   Focus on Therapeutic Outcomes (FOTO)   FOTO not taken MCD      Posture/Postural Control   Posture/Postural Control  Postural limitations    Postural Limitations  Rounded Shoulders;Forward head    Posture Comments  Pt leans to the right unweighting left hip      ROM / Strength   AROM / PROM / Strength  AROM;Strength      AROM   Overall AROM   Deficits    Right Hip Flexion  120    Right Hip External Rotation   45    Right Hip Internal Rotation   35    Right Hip ABduction  48    Left Hip External Rotation   15   pain   Left Hip Internal Rotation   25   pain   Left Hip ABduction  38    Lumbar Flexion  60    Lumbar Extension  15    Lumbar - Right Side Bend  24    Lumbar - Left Side Bend  20    Lumbar - Right Rotation  75 % available    Lumbar - Left Rotation  80% available      Strength   Overall Strength  Deficits    Right Hip Flexion  5/5    Right Hip Extension  4+/5    Right Hip External Rotation   5/5    Right Hip Internal Rotation  5/5    Right Hip ABduction  4/5    Left Hip Flexion  4/5    Left Hip Extension  4-/5    Left Hip External Rotation  4-/5    Left Hip Internal Rotation  4-/5    Left Hip ABduction  4-/5    Right Knee Flexion  5/5    Right Knee Extension  5/5    Left Knee Flexion  4+/5    Left Knee Extension  4/5      Flexibility   Hamstrings  limited 65 degrees on right and 50 on left       Palpation   Palpation comment  tenderness over left hip / piriformis      FABER test   findings  Positive    Side  LEft      Slump test   Findings  Negative    Comment  bil                   [facility] Adult PT Treatment/Exercise - 06/21/18 0823      Knee/Hip Exercises: Stretches   Piriformis Stretch  4 reps;30 seconds    Piriformis Stretch Limitations  2 in supine and 2 sitting for left hip             PT Education - 06/21/18 0848     Education Details  POC explanation of findings  piriformis stretch    Person(s) Educated  Patient    Methods  Explanation;Demonstration;Tactile cues;Verbal cues;Handout    Comprehension  Verbalized understanding;Returned demonstration       PT Short Term Goals - 06/21/18 4696      PT SHORT TERM GOAL #1   Title  Pt will be independent with inital HEP    Baseline  no knowledge    Time  3   Period  Weeks    Status  New    Target Date  07/12/18      PT SHORT TERM GOAL #2   Title  "Demonstrate understanding of proper sitting posture and be more conscious of position and posture throughout the day.     Baseline  Pt leans to right in chair to unweight left side    Time  3    Period  Weeks    Status  New    Target Date  07/12/18      PT SHORT TERM GOAL #3   Title  Pt will be able to sit with correct posture for at least 15-20 minutes with 4/10  pain or less    Time  3    Period  Weeks    Status  New    Target Date  07/12/18      PT SHORT TERM GOAL #4   Title  Pt will be able to bring left ankle on right knee with 50% greater ease.     Baseline  Pt must use UE to place left ankle on knee due to pain    Time  3    Period  Weeks    Status  New    Target Date  07/12/18        PT Long Term Goals - 06/21/18 1602      PT LONG TERM GOAL #1   Title  Pt will be independent with advanced HEP  New goals will be set after STGS achieved and MCD authorized    Time  6    Period  Weeks    Status  New    Target Date  08/04/18      PT LONG TERM GOAL #2   Title  "Demonstrate and verbalize techniques to reduce the risk of re-injury including: lifting, posture, body mechanics.     Time  6    Period  Weeks    Status  New    Target Date  08/04/18      PT LONG TERM GOAL #3  Title  "Pt will tolerate walking for 90 minutes without increased pain in order to return to PLOF and work     Time  6    Period  Weeks    Status  New    Target Date  08/04/18      PT LONG TERM GOAL #4    Title  "Pt will tolerate sitting 1 hour without increased pain to ride in car without increased pain    Time  6    Period  Weeks    Status  New    Target Date  08/04/18      PT LONG TERM GOAL #5   Title  "Pt will improve L knee/hip strength to >/= 4+/5 with </= 2/10 pain to promote safety with walking/standing activities    Time  6    Period  Weeks    Status  New    Target Date  08/04/18            Plan - 06/21/18 1558    Clinical Impression Statement  Pt is a 42 year old male with c/o of increased LBP and constant radicular pain into L hip following working at [employer] for one week just past Labor Day 2019. Pt has had increasing pain and inablilty to sit for greater than 5 minutes , sit for longer than 15 to 30 min or walk greater than 15 minutes.  Pt sit with lateral lean to right  during evaluation.  Pt did not change with extension exercises.  PT noted that pt has decreased IR/ER of left hip.  Pt will benefit from skilled PT to address pain/impairments and return to work PLOF    Clinical Presentation  Stable    Clinical Decision Making  Low    Rehab Potential  Good    PT Frequency  2x / week    PT Duration  6 weeks    PT Treatment/Interventions  ADLs/Self Care Home Management;Cryotherapy;Electrical Stimulation;Iontophoresis 4mg /ml Dexamethasone;Moist Heat;Ultrasound;Neuromuscular re-education;Therapeutic activities;Functional mobility training;Stair training;Gait training;Patient/family education;Manual techniques;Taping;Dry needling;Passive range of motion;Joint Manipulations    PT Next Visit Plan  add basic back for radiculopathy in to hip. Extension did not change or alleviate pain but try. right hip into wall for back pain. lateral glide  hip flexiibilty    PT Home Exercise Plan  piriformis stretch    Consulted and Agree with Plan of Care  Patient       Patient will benefit from skilled therapeutic intervention in order to improve the following deficits and impairments:  Pain,  Improper body mechanics, Postural dysfunction, Impaired flexibility, Increased muscle spasms, Increased fascial restricitons, Decreased activity tolerance, Decreased mobility, Decreased strength, Hypomobility, Decreased range of motion, Difficulty walking  Visit Diagnosis: Low back pain with sciatica, sciatica laterality unspecified, unspecified back pain laterality, unspecified chronicity  Pain in left hip  Stiffness of left hip, not elsewhere classified  Abnormal posture     Problem List Patient Active Problem List   Diagnosis Date Noted  . Herniated nucleus pulposus, lumbar 09/04/2011    Class: Acute  . Spinal stenosis of lumbar region with neurogenic claudication 09/04/2011    Class: Diagnosis of    Garen Lah, PT Certified Exercise Expert for the Aging Adult  06/21/18 4:13 PM Phone: [phone] Fax: [phone]  [facility] Outpatient Rehabilitation [facility] [address] [place], [place], [address] Phone: [phone]   Fax:  [phone]  Name: Jerry Scott MRN: [id number] Date of Birth: November 06, 1975

## 2018-06-21 NOTE — Patient Instructions (Signed)
   Hip Stretch  Put right ankle over left knee. Let right knee fall downward, but keep ankle in place. Feel the stretch in hip. May push down gently with hand to feel stretch. Hold ____ seconds while counting out loud. Repeat with other leg. Repeat _3___ times. Do __2-3__ sessions per day.    Stretching: Piriformis (Supine)  Pull right knee toward opposite shoulder. Hold __20-30__ seconds. Relax. Repeat _3___ times per set. Do ___1_ sets per session. Do __2-3__ sessions per day.  Piriformis Stretch, Kneeling Modified [place] .  Copyright  VHI. All rights reserved.   Garen Lah, PT Certified Exercise Expert for the Aging Adult  06/21/18 8:48 AM Phone: [phone] Fax: [phone]

## 2018-06-23 NOTE — Addendum Note (Signed)
Addended by: Jenelle Mages on: 06/23/2018 07:57 AM   Modules accepted: Orders

## 2018-06-29 ENCOUNTER — Encounter: Payer: Self-pay | Admitting: Physical Therapy

## 2018-06-29 ENCOUNTER — Ambulatory Visit: Payer: Medicaid Other | Admitting: Physical Therapy

## 2018-06-29 DIAGNOSIS — M25552 Pain in left hip: Secondary | ICD-10-CM

## 2018-06-29 DIAGNOSIS — M25652 Stiffness of left hip, not elsewhere classified: Secondary | ICD-10-CM

## 2018-06-29 DIAGNOSIS — M5440 Lumbago with sciatica, unspecified side: Secondary | ICD-10-CM

## 2018-06-29 DIAGNOSIS — R293 Abnormal posture: Secondary | ICD-10-CM

## 2018-06-29 DIAGNOSIS — M6281 Muscle weakness (generalized): Secondary | ICD-10-CM

## 2018-06-29 NOTE — Therapy (Addendum)
[place], [place], [address] Phone: [phone]   Fax:  [phone]  Physical Therapy Treatment  Patient Details  Name: Jerry Scott MRN: [id number] Date of Birth: [birth year] Referring Provider (PT): Eunice Blase MD   Encounter Date: 06/29/2018  PT End of Session - 06/29/18 0929    Visit Number  2    Number of Visits  12    Date for PT Re-Evaluation  08/04/18    Authorization Type  Self pay, applied for CAFA    PT Start Time  0931    PT Stop Time  1014    PT Time Calculation (min)  43 min    Activity Tolerance  Patient limited by pain       Past Medical History:  Diagnosis Date  . Dysrhythmia    had echo done over 1 year ago at [facility]/ "normal"  . GERD (gastroesophageal reflux disease)   . HIV (human immunodeficiency virus infection) ([place])   . Hypertension    sees Dr. Tyson Babinski [phone]  . Shortness of breath    due to "smoking"    Past Surgical History:  Procedure Laterality Date  . HEMORRHOID SURGERY    . LUMBAR LAMINECTOMY  09/04/2011   Procedure: MICRODISCECTOMY LUMBAR LAMINECTOMY;  Surgeon: Jessy Oto, MD;  Location: [facility];  Service: Orthopedics;  Laterality: N/A;  Left L5-S1 Microdiscectomy with MIS Equipment    There were no vitals filed for this visit.  Subjective Assessment - 06/29/18 0932    Subjective  Pt has been performing his HEP he doesn't feel like they have helped.  The pain is still in his Lt leg to just below the knee. He is still having trouble with standing straight.     Currently in Pain?  Yes    Pain Score  7     Pain Location  Leg    Pain Orientation  Left    Pain Descriptors / Indicators  Throbbing;Aching    Pain Type  Acute pain    Pain Onset  1 to 4 weeks ago    Aggravating Factors   lifting    Pain Relieving Factors  lying down on back with Lt Leg propped up                       [facility] Adult PT Treatment/Exercise - 06/29/18  0001      Exercises   Exercises  Lumbar      Lumbar Exercises: Stretches   Single Knee to Chest Stretch  Left;Right;2 reps;30 seconds   with leg press on straight leg.    Prone on Elbows Stretch  3 reps;30 seconds    Prone on Elbows Stretch Limitations  repeated after supine stretches.     Press Ups  5 reps   3 sets   ITB Stretch  Left;2 reps;30 seconds   cross body with strap     Lumbar Exercises: Prone   Straight Leg Raise  10 reps    Opposite Arm/Leg Raise  Left arm/Right leg;Right arm/Left leg;10 reps    Other Prone Lumbar Exercises  10 reps pelvic press, then with knee flex/ext.       Modalities   Modalities  Ultrasound      Ultrasound   Ultrasound Location  Lt mid lumbar paraspinals    Ultrasound Parameters  100% 1.5w/cm2 - decreased to 0.90 d/t pt report of to much heat, 1.9mz    Ultrasound Goals  Pain;Other (Comment)   tone     Manual Therapy   Manual Therapy  Soft tissue mobilization;Joint mobilization    Joint Mobilization  prone sacral and lumbar CPA and bilat UPA mobs , pain at L2-3 with CPA, ok with UPAs    Soft tissue mobilization  STM to Lt lumbar paraspinals d/t tightenss             PT Education - 06/29/18 0943    Education Details  extension program    Methods  Explanation;Demonstration;Handout    Comprehension  Returned demonstration;Verbalized understanding       PT Short Term Goals - 06/21/18 0823      PT SHORT TERM GOAL #1   Title  Pt will be independent with inital HEP    Baseline  no knowledge    Time  3    Period  Weeks    Status  New    Target Date  07/12/18      PT SHORT TERM GOAL #2   Title  "Demonstrate understanding of proper sitting posture and be more conscious of position and posture throughout the day.     Baseline  Pt leans to right in chair to unweight left side    Time  3    Period  Weeks    Status  New    Target Date  07/12/18      PT SHORT TERM GOAL #3   Title  Pt will be able to sit with correct posture for  at least 15-20 minutes with 4/10  pain or less    Time  3    Period  Weeks    Status  New    Target Date  07/12/18      PT SHORT TERM GOAL #4   Title  Pt will be able to bring left ankle on right knee with 50% greater ease.     Baseline  Pt must use UE to place left ankle on knee due to pain    Time  3    Period  Weeks    Status  New    Target Date  07/12/18        PT Long Term Goals - 06/21/18 1602      PT LONG TERM GOAL #1   Title  Pt will be independent with advanced HEP  New goals will be set after STGS achieved and MCD authorized    Time  6    Period  Weeks    Status  New    Target Date  08/04/18      PT LONG TERM GOAL #2   Title  "Demonstrate and verbalize techniques to reduce the risk of re-injury including: lifting, posture, body mechanics.     Time  6    Period  Weeks    Status  New    Target Date  08/04/18      PT LONG TERM GOAL #3   Title  "Pt will tolerate walking for 90 minutes without increased pain in order to return to PLOF and work     Time  6    Period  Weeks    Status  New    Target Date  08/04/18      PT LONG TERM GOAL #4   Title  "Pt will tolerate sitting 1 hour without increased pain to ride in car without increased pain    Time  6    Period  Weeks    Status  New    Target Date  08/04/18      PT LONG TERM GOAL #5   Title  "Pt will improve L knee/hip strength to >/= 4+/5 with </= 2/10 pain to promote safety with walking/standing activities    Time  6    Period  Weeks    Status  New    Target Date  08/04/18            Plan - 06/29/18 1219    Clinical Impression Statement  This is Lestat's second visit, his pain was irritated with work in supine and stetches towards the chest. He has resolution of leg and most of back symptoms with extension of lumbar.Continue to progress extnesion, possibly try lumbar traction . He did have some tightness in the Lt lumbar paraspinals, this decreased after [place] and manual work.     Rehab Potential  Good     PT Frequency  2x / week    PT Duration  6 weeks    PT Treatment/Interventions  ADLs/Self Care Home Management;Cryotherapy;Electrical Stimulation;Iontophoresis 78m/ml Dexamethasone;Moist Heat;Ultrasound;Neuromuscular re-education;Therapeutic activities;Functional mobility training;Stair training;Gait training;Patient/family education;Manual techniques;Taping;Dry needling;Passive range of motion;Joint Manipulations    PT Next Visit Plan  if still has relief with extension add in pelvic press series , ? limbar traction    Consulted and Agree with Plan of Care  Patient       Patient will benefit from skilled therapeutic intervention in order to improve the following deficits and impairments:  Pain, Improper body mechanics, Postural dysfunction, Impaired flexibility, Increased muscle spasms, Increased fascial restricitons, Decreased activity tolerance, Decreased mobility, Decreased strength, Hypomobility, Decreased range of motion, Difficulty walking  Visit Diagnosis: Low back pain with sciatica, sciatica laterality unspecified, unspecified back pain laterality, unspecified chronicity  Pain in left hip  Stiffness of left hip, not elsewhere classified  Abnormal posture  Muscle weakness (generalized)     Problem List Patient Active Problem List   Diagnosis Date Noted  . Herniated nucleus pulposus, lumbar 09/04/2011    Class: Acute  . Spinal stenosis of lumbar region with neurogenic claudication 09/04/2011    Class: Diagnosis of    SJeral PinchPT 06/29/2018, 11:37 AM  [facility] [address]Phone: [phone]  Fax:  [phone] Name: DYOSMAR RYKERMRN: [id number] of Birth: [birth year] PHYSICAL THERAPY DISCHARGE SUMMARY  Visits from Start of Care: 2  Current functional level related to goals / functional outcomes: Unknown, pt no-showed for his last scheduled appointment and then  canceled the other scheduled one. Note in chart that he requested a return to work note with no restrictions   Remaining deficits: unknown   Education / Equipment: Initial HEP Plan:                                                    Patient goals were not met. Patient is being discharged due to not returning since the last visit.  ?????     SJeral Pinch PT 08/10/18 12:32 PM

## 2018-07-04 ENCOUNTER — Ambulatory Visit: Payer: Self-pay | Attending: Family Medicine | Admitting: Physical Therapy

## 2018-07-04 ENCOUNTER — Telehealth: Payer: Self-pay | Admitting: Physical Therapy

## 2018-07-04 NOTE — Telephone Encounter (Signed)
Left message, pt missed today's visit. Was reminded of next weeks visit and asked him to call if he was unable to attend.  Also informed if he would like to be seen this week to call and see if we have appointments open

## 2018-07-12 ENCOUNTER — Ambulatory Visit: Payer: Self-pay | Admitting: Physical Therapy

## 2018-07-13 ENCOUNTER — Ambulatory Visit: Payer: Self-pay | Admitting: Physical Therapy

## 2018-07-25 ENCOUNTER — Encounter (HOSPITAL_COMMUNITY): Payer: Self-pay

## 2018-07-25 ENCOUNTER — Emergency Department (HOSPITAL_COMMUNITY)
Admission: EM | Admit: 2018-07-25 | Discharge: 2018-07-26 | Disposition: A | Discharge status: Home / Self Care | Payer: Medicaid Other | Attending: Emergency Medicine | Admitting: Emergency Medicine

## 2018-07-25 ENCOUNTER — Other Ambulatory Visit: Payer: Self-pay

## 2018-07-25 DIAGNOSIS — I10 Essential (primary) hypertension: Secondary | ICD-10-CM | POA: Insufficient documentation

## 2018-07-25 DIAGNOSIS — F17210 Nicotine dependence, cigarettes, uncomplicated: Secondary | ICD-10-CM | POA: Insufficient documentation

## 2018-07-25 DIAGNOSIS — B20 Human immunodeficiency virus [HIV] disease: Secondary | ICD-10-CM | POA: Insufficient documentation

## 2018-07-25 DIAGNOSIS — K5900 Constipation, unspecified: Secondary | ICD-10-CM

## 2018-07-25 DIAGNOSIS — Z79899 Other long term (current) drug therapy: Secondary | ICD-10-CM | POA: Insufficient documentation

## 2018-07-25 NOTE — ED Triage Notes (Signed)
Pt here from home with complaints of the inability to have a bowel movement for the last 2 weeks.  Jerry SpanielSaid he starts to go and has the urge and then cannot go.  Pt A&Ox4. No urinary symptoms.

## 2018-07-26 ENCOUNTER — Ambulatory Visit: Payer: Self-pay | Admitting: Physical Therapy

## 2018-07-26 LAB — GC/CHLAMYDIA PROBE AMP (~~LOC~~) NOT AT ARMC
CHLAMYDIA, DNA PROBE: POSITIVE — AB
Neisseria Gonorrhea: NEGATIVE

## 2018-07-26 MED ORDER — DOXYCYCLINE HYCLATE 100 MG PO TABS
100.0000 mg | ORAL_TABLET | Freq: Once | ORAL | Status: AC
  Administered 2018-07-26: 100 mg via ORAL
  Filled 2018-07-26: qty 1

## 2018-07-26 MED ORDER — POLYETHYLENE GLYCOL 3350 17 GM/SCOOP PO POWD: 17.0000 g | Freq: Every day | ORAL | 0 refills | Status: DC

## 2018-07-26 MED ORDER — DOXYCYCLINE HYCLATE 100 MG PO CAPS: 100.0000 mg | ORAL_CAPSULE | Freq: Two times a day (BID) | ORAL | 0 refills | Status: DC

## 2018-07-26 MED ORDER — CEFTRIAXONE SODIUM 250 MG IJ SOLR
250.0000 mg | Freq: Once | INTRAMUSCULAR | Status: AC
  Administered 2018-07-26: 250 mg via INTRAMUSCULAR
  Filled 2018-07-26: qty 250

## 2018-07-26 MED ORDER — LIDOCAINE HCL (PF) 1 % IJ SOLN
INTRAMUSCULAR | Status: AC
  Filled 2018-07-26: qty 5

## 2018-07-26 NOTE — ED Provider Notes (Signed)
[facility]   CSN: [id number] Arrival date & time: 07/25/18  2328     History   Chief Complaint Chief Complaint  Patient presents with  . Abdominal Pain  . Constipation    HPI Jerry Scott is a 42 y.o. male.   42 year old male with a history of HIV, esophageal reflux, hypertension presents to the emergency department for evaluation of constipation.  He states that he has had difficulty having a bowel movement for the past 2 weeks.  Symptom onset was after receiving insertive anal intercourse from a male partner.  He states that the condom broke during intercourse.  He has been passing smaller, more firm bowel movements.  Did try a laxative on one occasion which caused him diarrhea.  Once the laxatives subsided, his bowel movements return to a more constipated state.  He has also been experiencing some rectal pain.  Notes a small amount of bright red blood on the toilet tissue when wiping at times.  He continues to pass flatus.  Denies any fevers, nausea, vomiting, difficulty voiding, dysuria.  Was visiting a friend in the hospital and felt it best to be checked out.  [facility] but was told he needed an appointment.  Has remained compliant with his ARVs; [facility].     Past Medical History:  Diagnosis Date  . Dysrhythmia    [facility] "[facility]"  . GERD (gastroesophageal reflux disease)   . HIV (human immunodeficiency virus infection) ([facility])   . Hypertension    sees Dr. Levada DyBoroso [phone]  . Shortness of breath    due to "smoking"    Patient Active Problem List   Diagnosis Date Noted  . Herniated nucleus pulposus, lumbar 09/04/2011    Class: Acute  . Spinal stenosis of lumbar region with neurogenic claudication 09/04/2011    Class: Diagnosis of    Past Surgical History:  Procedure Laterality Date  .  HEMORRHOID SURGERY    . LUMBAR LAMINECTOMY  09/04/2011   Procedure: MICRODISCECTOMY LUMBAR LAMINECTOMY;  Surgeon: Kerrin ChampagneJames E Nitka, MD;  [facility];  Service: Orthopedics;  Laterality: N/A;  Left L5-S1 Microdiscectomy with MIS Equipment        Home Medications    Prior to Admission medications   Medication Sig Start Date End Date Taking? Authorizing Provider  doxycycline (VIBRAMYCIN) 100 MG capsule Take 1 capsule (100 mg total) by mouth 2 (two) times daily. 07/26/18   Antony MaduraHumes, Yeriel Mineo, PA-C  efavirenz-emtrictabine-tenofovir (ATRIPLA) 600-200-300 MG per tablet Take 1 tablet by mouth daily.      [provider]  etodolac (LODINE) 400 MG tablet Take 1 tablet (400 mg total) by mouth 2 (two) times daily as needed. 06/06/18   Hilts, Casimiro NeedleMichael, MD  hydrochlorothiazide (HYDRODIURIL) 25 MG tablet Take 25 mg by mouth daily.      [provider]  ibuprofen (ADVIL,MOTRIN) 200 MG tablet Take 200 mg by mouth every 6 (six) hours as needed for moderate pain.    [provider]  oxyCODONE-acetaminophen (PERCOCET/ROXICET) 5-325 MG per tablet Take 1 tablet by mouth every 8 (eight) hours as needed for moderate pain or severe pain. 04/16/14   Sciacca, Marissa, PA-C  polyethylene glycol powder (GLYCOLAX/MIRALAX) powder Take 17 g by mouth daily. Until daily soft stools  OTC 07/26/18   Antony MaduraHumes, Carmelle Bamberg, PA-C  tiZANidine (ZANAFLEX) 2 MG tablet Take 1-2  tablets (2-4 mg total) by mouth every 6 (six) hours as needed for muscle spasms. 06/06/18   Hilts, Casimiro Needle, MD    Family History History reviewed. No pertinent family history.  Social History Social History   Tobacco Use  . Smoking status: Current Every Day Smoker    Years: 15.00    Types: Cigarettes  . Smokeless tobacco: Never Used  Substance Use Topics  . Alcohol use: Yes    Comment: occassional  . Drug use: Yes    Types: Marijuana     Allergies   Tramadol   Review of Systems Review of Systems Ten systems reviewed and are  negative for acute change, except as noted in the HPI.    Physical Exam Updated Vital Signs BP (!) 142/100   Pulse 90   Temp 98.2 F (36.8 C) (Oral)   Resp 18   SpO2 99%   Physical Exam  Constitutional: He is oriented to person, place, and time. He appears well-developed and well-nourished. No distress.  Alert and nontoxic appearing  HENT:  Head: Normocephalic and atraumatic.  Eyes: Conjunctivae and EOM are normal. No scleral icterus.  Neck: Normal range of motion.  Cardiovascular: Normal rate, regular rhythm and intact distal pulses.  Pulmonary/Chest: Effort normal. No stridor. No respiratory distress.  Respirations even and unlabored  Abdominal: Soft. He exhibits no distension and no mass. There is no tenderness. There is no guarding.  Soft, nondistended, nontender.  Genitourinary:  Genitourinary Comments: Exam chaperoned by Sheffield Slider, RN tech. Preserved rectal tone without obvious rectal discharge. No lesions, hemorrhoids, fissures. TTP on DRE with palpable induration or fluctuance.  Non-boggy prostate.  Musculoskeletal: Normal range of motion.  Neurological: He is alert and oriented to person, place, and time.  Skin: Skin is warm and dry. No rash noted. He is not diaphoretic. No erythema. No pallor.  Psychiatric: He has a normal mood and affect. His behavior is normal.  Nursing note and vitals reviewed.    ED Treatments / Results  Labs (all labs ordered are listed, but only abnormal results are displayed) Labs Reviewed  RPR  GC/CHLAMYDIA PROBE AMP ([place]) NOT AT [facility]    EKG None  Radiology No results found.  Procedures Procedures (including critical care time)  Medications Ordered in ED Medications  lidocaine (PF) (XYLOCAINE) 1 % injection (has no administration in time range)  cefTRIAXone (ROCEPHIN) injection 250 mg (250 mg Intramuscular Given 07/26/18 0059)  doxycycline (VIBRA-TABS) tablet 100 mg (100 mg Oral Given 07/26/18 0058)     Initial  Impression / Assessment and Plan / ED Course  I have reviewed the triage vital signs and the nursing notes.  Pertinent labs & imaging results that were available during my care of the patient were reviewed by me and considered in my medical decision making (see chart for details).     42 year old HIV+ male presents to the emergency department for evaluation of constipation.  He states that symptoms have been present for the past 2 weeks.  Notes harder bowel movements as well as some rectal discomfort when having a bowel movement.  Triage note references tenesmus.  States that symptoms began after insertive anal sex and that the condom broke.  Patient has a generally reassuring exam.  He has no anterior abdominal tenderness.  No peritoneal signs.  Given chronicity of symptoms, stable vital signs, lack of fever or other infectious symptoms.  I do not believe further emergent work-up is indicated.  He continues to pass flatus and has not  experienced nausea or vomiting.  Doubt SBO/pSBO.  Exam is concerning for proctitis.  For this reason, will discharge on course of doxycycline.  He was given Rocephin while in the ED.  Also encouraged the use of MiraLAX for regular bowel movements.  Patient appropriate for follow-up with his primary care doctor.  Return precautions discussed and provided. Patient discharged in stable condition with no unaddressed concerns.   Final Clinical Impressions(s) / ED Diagnoses   Final diagnoses:  Constipation, unspecified constipation type    ED Discharge Orders         Ordered    doxycycline (VIBRAMYCIN) 100 MG capsule  2 times daily     07/26/18 0049    polyethylene glycol powder (GLYCOLAX/MIRALAX) powder  Daily     07/26/18 0049           Antony Madura, PA-C 07/26/18 1610    Zadie Rhine, MD 07/26/18 0301

## 2018-07-26 NOTE — ED Notes (Signed)
Patient Alert and oriented to baseline. Stable and ambulatory to baseline. Patient verbalized understanding of the discharge instructions.  Patient belongings were taken by the patient.   

## 2018-07-26 NOTE — Discharge Instructions (Signed)
Take doxycycline as prescribed until finished for treatment of presumed proctitis.  Continue to drink plenty of fluids to prevent dehydration and to help improve constipation.  You have also been given a prescription for MiraLAX to use to promote soft stool.  You may take Tylenol or ibuprofen for any complaints of pain.    You have STD tests pending.  Follow-up on the results of these tests with the health department in 48 hours.  Notify all sexual partners if your STD results are positive.  Follow-up with your primary care doctor to ensure resolution of symptoms.

## 2018-07-31 LAB — RPR, QUANT+TP ABS (REFLEX): TREPONEMA PALLIDUM AB: REACTIVE — AB

## 2018-07-31 LAB — RPR: RPR: REACTIVE — AB

## 2018-08-01 ENCOUNTER — Telehealth (INDEPENDENT_AMBULATORY_CARE_PROVIDER_SITE_OTHER): Payer: Self-pay | Admitting: Family Medicine

## 2018-08-01 ENCOUNTER — Encounter (INDEPENDENT_AMBULATORY_CARE_PROVIDER_SITE_OTHER): Payer: Self-pay

## 2018-08-01 NOTE — Telephone Encounter (Signed)
Ok to give note 

## 2018-08-01 NOTE — Telephone Encounter (Signed)
Patient called asked  he can get a note releasing him back to work without restrictions. Patient asked if the note can be emailed to him. The email address is [email or web address]. The number to contact patient is [phone]

## 2018-08-01 NOTE — Telephone Encounter (Signed)
Note written and emailed to the patient. 

## 2018-08-01 NOTE — Telephone Encounter (Signed)
Please advise. He has no followup appointment scheduled here.

## 2018-12-21 ENCOUNTER — Telehealth (INDEPENDENT_AMBULATORY_CARE_PROVIDER_SITE_OTHER): Payer: Self-pay | Admitting: Family Medicine

## 2018-12-21 NOTE — Telephone Encounter (Signed)
Please advise 

## 2018-12-21 NOTE — Telephone Encounter (Signed)
Sorry, but can't prescribe narcotics for his pain.  Needs to see pain clinic for that.

## 2018-12-21 NOTE — Telephone Encounter (Signed)
Patient called stating that his left leg/knee is hurting really bad and that he was seeing a pain management doctor, but that clinic is now shut down.  He also did his PT and nothing is helping with the pain.  He wanted to know if Dr. Prince Rome would send him in an RX for Oxycodone.  [facility] uses [facility]  CB#[phone].  Thank you.

## 2018-12-22 NOTE — Telephone Encounter (Signed)
Left full message on the patient's voice mail.

## 2019-02-06 ENCOUNTER — Other Ambulatory Visit: Payer: Self-pay

## 2019-02-06 ENCOUNTER — Encounter (HOSPITAL_COMMUNITY): Payer: Self-pay | Admitting: Emergency Medicine

## 2019-02-06 ENCOUNTER — Emergency Department (HOSPITAL_COMMUNITY)
Admission: EM | Admit: 2019-02-06 | Discharge: 2019-02-06 | Disposition: A | Discharge status: Home / Self Care | Payer: Medicaid Other | Attending: Emergency Medicine | Admitting: Emergency Medicine

## 2019-02-06 DIAGNOSIS — Z21 Asymptomatic human immunodeficiency virus [HIV] infection status: Secondary | ICD-10-CM | POA: Insufficient documentation

## 2019-02-06 DIAGNOSIS — F17210 Nicotine dependence, cigarettes, uncomplicated: Secondary | ICD-10-CM | POA: Insufficient documentation

## 2019-02-06 DIAGNOSIS — M25561 Pain in right knee: Secondary | ICD-10-CM | POA: Insufficient documentation

## 2019-02-06 DIAGNOSIS — I10 Essential (primary) hypertension: Secondary | ICD-10-CM | POA: Insufficient documentation

## 2019-02-06 DIAGNOSIS — M79641 Pain in right hand: Secondary | ICD-10-CM | POA: Insufficient documentation

## 2019-02-06 DIAGNOSIS — M25562 Pain in left knee: Secondary | ICD-10-CM | POA: Insufficient documentation

## 2019-02-06 DIAGNOSIS — Z79899 Other long term (current) drug therapy: Secondary | ICD-10-CM | POA: Insufficient documentation

## 2019-02-06 DIAGNOSIS — M79642 Pain in left hand: Secondary | ICD-10-CM | POA: Insufficient documentation

## 2019-02-06 LAB — CBC WITH DIFFERENTIAL/PLATELET
Abs Immature Granulocytes: 0.03 10*3/uL (ref 0.00–0.07)
Basophils Absolute: 0 10*3/uL (ref 0.0–0.1)
Basophils Relative: 0 %
Eosinophils Absolute: 0.2 10*3/uL (ref 0.0–0.5)
Eosinophils Relative: 2 %
HCT: 46.4 % (ref 39.0–52.0)
Hemoglobin: 15.4 g/dL (ref 13.0–17.0)
Immature Granulocytes: 0 %
Lymphocytes Relative: 29 %
Lymphs Abs: 2.4 10*3/uL (ref 0.7–4.0)
MCH: 30.3 pg (ref 26.0–34.0)
MCHC: 33.2 g/dL (ref 30.0–36.0)
MCV: 91.3 fL (ref 80.0–100.0)
Monocytes Absolute: 0.6 10*3/uL (ref 0.1–1.0)
Monocytes Relative: 8 %
Neutro Abs: 5 10*3/uL (ref 1.7–7.7)
Neutrophils Relative %: 61 %
Platelets: 301 10*3/uL (ref 150–400)
RBC: 5.08 MIL/uL (ref 4.22–5.81)
RDW: 12.6 % (ref 11.5–15.5)
WBC: 8.2 10*3/uL (ref 4.0–10.5)
nRBC: 0 % (ref 0.0–0.2)

## 2019-02-06 LAB — C-REACTIVE PROTEIN: CRP: 6.9 mg/dL — ABNORMAL HIGH (ref <1.0)

## 2019-02-06 LAB — SEDIMENTATION RATE: Sed Rate: 45 mm/hr — ABNORMAL HIGH (ref 0–16)

## 2019-02-06 LAB — COMPREHENSIVE METABOLIC PANEL
ALT: 24 U/L (ref 0–44)
AST: 27 U/L (ref 15–41)
Albumin: 4.2 g/dL (ref 3.5–5.0)
Alkaline Phosphatase: 97 U/L (ref 38–126)
Anion gap: 12 (ref 5–15)
BUN: 10 mg/dL (ref 6–20)
CO2: 26 mmol/L (ref 22–32)
Calcium: 9.7 mg/dL (ref 8.9–10.3)
Chloride: 100 mmol/L (ref 98–111)
Creatinine, Ser: 1 mg/dL (ref 0.61–1.24)
GFR calc Af Amer: >60 mL/min (ref >60)
GFR calc non Af Amer: >60 mL/min (ref >60)
Glucose, Bld: 98 mg/dL (ref 70–99)
Potassium: 3.1 mmol/L — ABNORMAL LOW (ref 3.5–5.1)
Sodium: 138 mmol/L (ref 135–145)
Total Bilirubin: 0.5 mg/dL (ref 0.3–1.2)
Total Protein: 9 g/dL — ABNORMAL HIGH (ref 6.5–8.1)

## 2019-02-06 MED ORDER — IBUPROFEN 200 MG PO TABS
600.0000 mg | ORAL_TABLET | Freq: Once | ORAL | Status: AC
  Administered 2019-02-06: 600 mg via ORAL
  Filled 2019-02-06: qty 3

## 2019-02-06 MED ORDER — OXYCODONE-ACETAMINOPHEN 5-325 MG PO TABS
1.0000 | ORAL_TABLET | Freq: Once | ORAL | Status: AC
  Administered 2019-02-06: 12:00:00 1 via ORAL
  Filled 2019-02-06: qty 1

## 2019-02-06 NOTE — Discharge Instructions (Addendum)
Please call your infectious disease specialist at [facility] for close follow-up  Take ibuprofen and Tylenol for pain

## 2019-02-06 NOTE — ED Provider Notes (Signed)
[facility] DEPT Provider Note   CSN: [id number] Arrival date & time: 02/06/19  3299    History   Chief Complaint Chief Complaint  Patient presents with  . Knee Pain    HPI Jerry Scott is a 43 y.o. male.     HPI Patient is a 43 year old male presents the emergency department complaints of bilateral aching knee pain as well as pain in both of his hands left greater than right over the past 48 hours.  He has a history of HIV.  He reports compliance with his medications.  He denies fevers or chills.  He reports he was recently told that he was positive for chlamydia and was treated with oral antibiotics.  He states he was negative for gonorrhea.  He continues to engage in at risk behavior   Past Medical History:  Diagnosis Date  . Dysrhythmia    had echo done over 1 year ago at [facility] hosp/ "normal"  . GERD (gastroesophageal reflux disease)   . HIV (human immunodeficiency virus infection) ([place])   . Hypertension    sees Dr. Tyson Babinski [phone]  . Shortness of breath    due to "smoking"    Patient Active Problem List   Diagnosis Date Noted  . Herniated nucleus pulposus, lumbar 09/04/2011    Class: Acute  . Spinal stenosis of lumbar region with neurogenic claudication 09/04/2011    Class: Diagnosis of    Past Surgical History:  Procedure Laterality Date  . HEMORRHOID SURGERY    . LUMBAR LAMINECTOMY  09/04/2011   Procedure: MICRODISCECTOMY LUMBAR LAMINECTOMY;  Surgeon: Jessy Oto, MD;  Location: [place];  Service: Orthopedics;  Laterality: N/A;  Left L5-S1 Microdiscectomy with MIS Equipment        Home Medications    Prior to Admission medications   Medication Sig Start Date End Date Taking? Authorizing Provider  DOVATO 50-300 MG TABS Take 1 tablet by mouth daily. 01/17/19  Yes [provider]  hydrochlorothiazide (HYDRODIURIL) 25 MG tablet Take 25 mg by mouth daily.     Yes [provider]  TRIUMEQ 600-50-300 MG  tablet Take 1 tablet by mouth daily. 12/26/18  Yes [provider]  atenolol (TENORMIN) 25 MG tablet Take 25 mg by mouth daily. 12/26/18   [provider]    Family History No family history on file.  Social History Social History   Tobacco Use  . Smoking status: Current Every Day Smoker    Years: 15.00    Types: Cigarettes  . Smokeless tobacco: Never Used  Substance Use Topics  . Alcohol use: Yes    Comment: occassional  . Drug use: Yes    Types: Marijuana     Allergies   Tramadol   Review of Systems Review of Systems  All other systems reviewed and are negative.    Physical Exam Updated Vital Signs BP (!) 134/98   Pulse 80   Temp 98.9 F (37.2 C) (Oral)   Resp 18   SpO2 99%   Physical Exam Vitals signs and nursing note reviewed.  Constitutional:      Appearance: He is well-developed.  HENT:     Head: Normocephalic and atraumatic.  Neck:     Musculoskeletal: Normal range of motion.  Cardiovascular:     Rate and Rhythm: Normal rate and regular rhythm.     Heart sounds: Normal heart sounds.  Pulmonary:     Effort: Pulmonary effort is normal. No respiratory distress.  Breath sounds: Normal breath sounds.  Abdominal:     General: There is no distension.     Palpations: Abdomen is soft.     Tenderness: There is no abdominal tenderness.  Musculoskeletal: Normal range of motion.     Comments: Full range of motion of bilateral ankles, knees, hips.  Normal pulses bilaterally in both PT and DP arteries.  Full range of motion of bilateral shoulders, elbows, wrist.  No focal tenderness of either hand  Skin:    General: Skin is warm and dry.  Neurological:     Mental Status: He is alert and oriented to person, place, and time.  Psychiatric:        Judgment: Judgment normal.      ED Treatments / Results  Labs (all labs ordered are listed, but only abnormal results are displayed) Labs Reviewed  COMPREHENSIVE METABOLIC PANEL - Abnormal;  Notable for the following components:      Result Value   Potassium 3.1 (*)    Total Protein 9.0 (*)    All other components within normal limits  C-REACTIVE PROTEIN - Abnormal; Notable for the following components:   CRP 6.9 (*)    All other components within normal limits  CULTURE, BLOOD (ROUTINE X 2)  CULTURE, BLOOD (ROUTINE X 2)  CBC WITH DIFFERENTIAL/PLATELET  RPR  SEDIMENTATION RATE    EKG None  Radiology No results found.  Procedures Procedures (including critical care time)  Medications Ordered in ED Medications  oxyCODONE-acetaminophen (PERCOCET/ROXICET) 5-325 MG per tablet 1 tablet (1 tablet Oral Given 02/06/19 1145)  ibuprofen (ADVIL) tablet 600 mg (600 mg Oral Given 02/06/19 1145)     Initial Impression / Assessment and Plan / ED Course  I have reviewed the triage vital signs and the nursing notes.  Pertinent labs & imaging results that were available during my care of the patient were reviewed by me and considered in my medical decision making (see chart for details).        Diffuse arthralgias.  Recently negative for gonorrhea making disseminated gonococcal disease less likely.  Blood cultures obtained.  Labs obtained.  No significant abnormality except for nonspecific elevated CRP at this point.  The majority the labs were obtained including the blood cultures for close follow-up purposes with his infectious disease team.  I do not think he needs additional testing at this time nor does he need acute hospitalization.  No signs to suggest gouty arthritis or septic arthritis at this time.  Afebrile.  Vital signs otherwise stable.  Recommended ibuprofen and Tylenol at home for pain control  Final Clinical Impressions(s) / ED Diagnoses   Final diagnoses:  Arthralgia of both knees    ED Discharge Orders    None       Azalia Bilisampos, Jkayla Spiewak, MD 02/06/19 1340

## 2019-02-06 NOTE — ED Triage Notes (Signed)
Per [facility] pt from home for bilat knee pains for 2 days. Denies falls or injuries or swelling. Per EMS Pt was ambulatory at scene  Vitals: 132/80, 90HR, 96%, 97.9 temp

## 2019-02-07 LAB — RPR: RPR Ser Ql: REACTIVE — AB

## 2019-02-07 LAB — RPR, QUANT+TP ABS (REFLEX)
Rapid Plasma Reagin, Quant: 1:2 {titer} — ABNORMAL HIGH
T Pallidum Abs: REACTIVE — AB

## 2019-02-11 LAB — CULTURE, BLOOD (ROUTINE X 2)
Culture: NO GROWTH
Culture: NO GROWTH
Special Requests: ADEQUATE
Special Requests: ADEQUATE

## 2019-03-02 ENCOUNTER — Encounter (HOSPITAL_COMMUNITY): Payer: Self-pay

## 2019-03-02 ENCOUNTER — Emergency Department (HOSPITAL_COMMUNITY): Payer: Self-pay

## 2019-03-02 ENCOUNTER — Other Ambulatory Visit: Payer: Self-pay

## 2019-03-02 ENCOUNTER — Emergency Department (HOSPITAL_COMMUNITY)
Admission: EM | Admit: 2019-03-02 | Discharge: 2019-03-02 | Disposition: A | Discharge status: Home / Self Care | Payer: Self-pay | Attending: Emergency Medicine | Admitting: Emergency Medicine

## 2019-03-02 DIAGNOSIS — Z79899 Other long term (current) drug therapy: Secondary | ICD-10-CM | POA: Insufficient documentation

## 2019-03-02 DIAGNOSIS — I10 Essential (primary) hypertension: Secondary | ICD-10-CM | POA: Insufficient documentation

## 2019-03-02 DIAGNOSIS — R112 Nausea with vomiting, unspecified: Secondary | ICD-10-CM | POA: Insufficient documentation

## 2019-03-02 DIAGNOSIS — R1031 Right lower quadrant pain: Secondary | ICD-10-CM | POA: Insufficient documentation

## 2019-03-02 DIAGNOSIS — F17210 Nicotine dependence, cigarettes, uncomplicated: Secondary | ICD-10-CM | POA: Insufficient documentation

## 2019-03-02 DIAGNOSIS — R197 Diarrhea, unspecified: Secondary | ICD-10-CM | POA: Insufficient documentation

## 2019-03-02 DIAGNOSIS — Z21 Asymptomatic human immunodeficiency virus [HIV] infection status: Secondary | ICD-10-CM | POA: Insufficient documentation

## 2019-03-02 LAB — COMPREHENSIVE METABOLIC PANEL
ALT: 55 U/L — ABNORMAL HIGH (ref 0–44)
AST: 70 U/L — ABNORMAL HIGH (ref 15–41)
Albumin: 4.7 g/dL (ref 3.5–5.0)
Alkaline Phosphatase: 115 U/L (ref 38–126)
Anion gap: 14 (ref 5–15)
BUN: 10 mg/dL (ref 6–20)
CO2: 20 mmol/L — ABNORMAL LOW (ref 22–32)
Calcium: 9.6 mg/dL (ref 8.9–10.3)
Chloride: 101 mmol/L (ref 98–111)
Creatinine, Ser: 1.46 mg/dL — ABNORMAL HIGH (ref 0.61–1.24)
GFR calc Af Amer: >60 mL/min (ref >60)
GFR calc non Af Amer: 59 mL/min — ABNORMAL LOW (ref >60)
Glucose, Bld: 133 mg/dL — ABNORMAL HIGH (ref 70–99)
Potassium: 3.3 mmol/L — ABNORMAL LOW (ref 3.5–5.1)
Sodium: 135 mmol/L (ref 135–145)
Total Bilirubin: 0.3 mg/dL (ref 0.3–1.2)
Total Protein: 10.3 g/dL — ABNORMAL HIGH (ref 6.5–8.1)

## 2019-03-02 LAB — CBC
HCT: 53.3 % — ABNORMAL HIGH (ref 39.0–52.0)
Hemoglobin: 17.8 g/dL — ABNORMAL HIGH (ref 13.0–17.0)
MCH: 30.6 pg (ref 26.0–34.0)
MCHC: 33.4 g/dL (ref 30.0–36.0)
MCV: 91.7 fL (ref 80.0–100.0)
Platelets: 324 10*3/uL (ref 150–400)
RBC: 5.81 MIL/uL (ref 4.22–5.81)
RDW: 13.2 % (ref 11.5–15.5)
WBC: 7.4 10*3/uL (ref 4.0–10.5)
nRBC: 0 % (ref 0.0–0.2)

## 2019-03-02 LAB — LIPASE, BLOOD: Lipase: 29 U/L (ref 11–51)

## 2019-03-02 MED ORDER — ONDANSETRON 4 MG PO TBDP
4.0000 mg | ORAL_TABLET | Freq: Once | ORAL | Status: AC | PRN
  Administered 2019-03-02: 4 mg via ORAL
  Filled 2019-03-02: qty 1

## 2019-03-02 MED ORDER — IOHEXOL 300 MG/ML  SOLN
100.0000 mL | Freq: Once | INTRAMUSCULAR | Status: AC | PRN
  Administered 2019-03-02: 100 mL via INTRAVENOUS

## 2019-03-02 MED ORDER — MORPHINE SULFATE (PF) 4 MG/ML IV SOLN
4.0000 mg | Freq: Once | INTRAVENOUS | Status: AC
  Administered 2019-03-02: 04:00:00 4 mg via INTRAVENOUS
  Filled 2019-03-02: qty 1

## 2019-03-02 MED ORDER — ONDANSETRON HCL 4 MG/2ML IJ SOLN
4.0000 mg | Freq: Once | INTRAMUSCULAR | Status: AC
  Administered 2019-03-02: 4 mg via INTRAVENOUS
  Filled 2019-03-02: qty 2

## 2019-03-02 MED ORDER — SODIUM CHLORIDE 0.9 % IV BOLUS
1000.0000 mL | Freq: Once | INTRAVENOUS | Status: AC
  Administered 2019-03-02: 06:00:00 1000 mL via INTRAVENOUS

## 2019-03-02 MED ORDER — SODIUM CHLORIDE (PF) 0.9 % IJ SOLN
INTRAMUSCULAR | Status: AC
  Administered 2019-03-02: 06:00:00 10 mL
  Filled 2019-03-02: qty 50

## 2019-03-02 MED ORDER — SODIUM CHLORIDE 0.9% FLUSH
3.0000 mL | Freq: Once | INTRAVENOUS | Status: AC
  Administered 2019-03-02: 3 mL via INTRAVENOUS

## 2019-03-02 MED ORDER — ONDANSETRON 4 MG PO TBDP: 4.0000 mg | ORAL_TABLET | Freq: Three times a day (TID) | ORAL | 0 refills | Status: AC | PRN

## 2019-03-02 MED ORDER — SODIUM CHLORIDE 0.9 % IV BOLUS
1000.0000 mL | Freq: Once | INTRAVENOUS | Status: AC
  Administered 2019-03-02: 04:00:00 1000 mL via INTRAVENOUS

## 2019-03-02 NOTE — ED Notes (Signed)
Bed: [facility]PT1 Expected date:  Expected time:  Means of arrival:  Comments: 

## 2019-03-02 NOTE — ED Notes (Signed)
Attempted IV x2. 

## 2019-03-02 NOTE — ED Provider Notes (Addendum)
[facility] DEPT Provider Note   CSN: [id number] Arrival date & time: 03/02/19  0235     History   Chief Complaint Chief Complaint  Patient presents with  . Abdominal Pain  . Nausea  . Emesis    HPI Jerry Scott is a 43 y.o. male.     Patient presents to the emergency department with a chief complaint of abdominal pain.  He reports associated nausea, vomiting, and diarrhea for the past couple of days.  He denies any fevers or chills.  Denies any treatments prior to arrival.  There are no aggravating factors.  No sick contacts.  The history is provided by the patient. No language interpreter was used.    Past Medical History:  Diagnosis Date  . Dysrhythmia    had echo done over 1 year ago at [facility] hosp/ "normal"  . GERD (gastroesophageal reflux disease)   . HIV (human immunodeficiency virus infection) ([place])   . Hypertension    sees Dr. Tyson Babinski [phone]  . Shortness of breath    due to "smoking"    Patient Active Problem List   Diagnosis Date Noted  . Herniated nucleus pulposus, lumbar 09/04/2011    Class: Acute  . Spinal stenosis of lumbar region with neurogenic claudication 09/04/2011    Class: Diagnosis of    Past Surgical History:  Procedure Laterality Date  . HEMORRHOID SURGERY    . LUMBAR LAMINECTOMY  09/04/2011   Procedure: MICRODISCECTOMY LUMBAR LAMINECTOMY;  Surgeon: Jessy Oto, MD;  Location: [place];  Service: Orthopedics;  Laterality: N/A;  Left L5-S1 Microdiscectomy with MIS Equipment        Home Medications    Prior to Admission medications   Medication Sig Start Date End Date Taking? Authorizing Provider  atenolol (TENORMIN) 25 MG tablet Take 25 mg by mouth daily. 12/26/18   [provider]  DOVATO 50-300 MG TABS Take 1 tablet by mouth daily. 01/17/19   [provider]  hydrochlorothiazide (HYDRODIURIL) 25 MG tablet Take 25 mg by mouth daily.      [provider]  TRIUMEQ  600-50-300 MG tablet Take 1 tablet by mouth daily. 12/26/18   [provider]    Family History No family history on file.  Social History Social History   Tobacco Use  . Smoking status: Current Every Day Smoker    Years: 15.00    Types: Cigarettes  . Smokeless tobacco: Never Used  Substance Use Topics  . Alcohol use: Yes    Comment: occassional  . Drug use: Yes    Types: Marijuana     Allergies   Tramadol   Review of Systems Review of Systems  All other systems reviewed and are negative.    Physical Exam Updated Vital Signs BP (!) 147/110 (BP Location: Left Arm)   Pulse (!) 110   Temp 99 F (37.2 C) (Oral)   Resp 18   Ht 5\' 11"  (1.803 m)   Wt 102.5 kg   SpO2 97%   BMI 31.52 kg/m   Physical Exam Vitals signs and nursing note reviewed.  Constitutional:      Appearance: He is well-developed.  HENT:     Head: Normocephalic and atraumatic.  Eyes:     Conjunctiva/sclera: Conjunctivae normal.  Neck:     Musculoskeletal: Neck supple.  Cardiovascular:     Rate and Rhythm: Normal rate and regular rhythm.     Heart sounds: No murmur.  Pulmonary:     Effort:  Pulmonary effort is normal. No respiratory distress.     Breath sounds: Normal breath sounds.  Abdominal:     Palpations: Abdomen is soft.     Tenderness: There is abdominal tenderness in the right lower quadrant.  Skin:    General: Skin is warm and dry.  Neurological:     Mental Status: He is alert and oriented to person, place, and time.  Psychiatric:        Mood and Affect: Mood normal.        Behavior: Behavior normal.      ED Treatments / Results  Labs (all labs ordered are listed, but only abnormal results are displayed) Labs Reviewed  LIPASE, BLOOD  COMPREHENSIVE METABOLIC PANEL  CBC  URINALYSIS, ROUTINE W REFLEX MICROSCOPIC    EKG EKG Interpretation  Date/Time:  Thursday March 02 2019 03:11:07 EDT Ventricular Rate:  109 PR Interval:    QRS Duration: 103 QT Interval:   324 QTC Calculation: 437 R Axis:   79 Text Interpretation:  Sinus tachycardia LAE, consider biatrial enlargement RSR' in V1 or V2, right VCD or RVH No significant change since last tracing Confirmed by Zadie RhineWickline, Donald ([id number]) on 03/02/2019 3:19:43 AM   Radiology Dg Chest 2 View  Result Date: 03/02/2019 CLINICAL DATA:  Shortness of breath EXAM: CHEST - 2 VIEW COMPARISON:  09/04/2011 FINDINGS: The heart size and mediastinal contours are within normal limits. Both lungs are clear. The visualized skeletal structures are unremarkable. IMPRESSION: No active cardiopulmonary disease. Electronically Signed   By: Jasmine PangKim  Fujinaga M.D.   On: 03/02/2019 03:11    Procedures Procedures (including critical care time)  Medications Ordered in ED Medications  sodium chloride flush (NS) 0.9 % injection 3 mL (has no administration in time range)  ondansetron (ZOFRAN-ODT) disintegrating tablet 4 mg (has no administration in time range)  sodium chloride 0.9 % bolus 1,000 mL (has no administration in time range)  morphine 4 MG/ML injection 4 mg (has no administration in time range)  ondansetron (ZOFRAN) injection 4 mg (has no administration in time range)     Initial Impression / Assessment and Plan / ED Course  I have reviewed the triage vital signs and the nursing notes.  Pertinent labs & imaging results that were available during my care of the patient were reviewed by me and considered in my medical decision making (see chart for details).        Patient with nausea, vomiting, diarrhea.  Symptoms have been ongoing for the past couple of days.  He complains of generalized abdominal pain.  He is tender in the right lower quadrant.  Laboratory work-up is reassuring.  Perhaps slightly dehydrated, given fluids.  CT shows no acute surgical process.  We will discharged home with Zofran.  Recommend PCP follow-up.  Final Clinical Impressions(s) / ED Diagnoses   Final diagnoses:  Nausea vomiting and diarrhea     ED Discharge Orders    None       Roxy HorsemanBrowning, Adalea Handler, PA-C 03/02/19 0615    Roxy HorsemanBrowning, Kalise Fickett, PA-C 03/02/19 [phone]    Zadie RhineWickline, Donald, MD 03/02/19 [phone]

## 2019-03-02 NOTE — ED Notes (Signed)
Patient transported to CT 

## 2019-03-02 NOTE — ED Triage Notes (Signed)
Pt BIB [facility] from home. Pt c/o abd pain x4 days and N/V/D since 1900. Pt was given 4mg  of IM Zofran by EMS. Pt also reports CP from dry heaving. Pt denies fever.

## 2020-03-28 ENCOUNTER — Emergency Department (HOSPITAL_COMMUNITY)
Admission: EM | Admit: 2020-03-28 | Discharge: 2020-03-28 | Disposition: A | Discharge status: Home / Self Care | Payer: PRIVATE HEALTH INSURANCE | Attending: Emergency Medicine | Admitting: Emergency Medicine

## 2020-03-28 ENCOUNTER — Other Ambulatory Visit: Payer: Self-pay

## 2020-03-28 ENCOUNTER — Encounter (HOSPITAL_COMMUNITY): Payer: Self-pay | Admitting: *Deleted

## 2020-03-28 DIAGNOSIS — Z79899 Other long term (current) drug therapy: Secondary | ICD-10-CM | POA: Insufficient documentation

## 2020-03-28 DIAGNOSIS — I10 Essential (primary) hypertension: Secondary | ICD-10-CM | POA: Diagnosis not present

## 2020-03-28 DIAGNOSIS — K0889 Other specified disorders of teeth and supporting structures: Secondary | ICD-10-CM | POA: Diagnosis not present

## 2020-03-28 DIAGNOSIS — M79602 Pain in left arm: Secondary | ICD-10-CM | POA: Diagnosis not present

## 2020-03-28 DIAGNOSIS — F17210 Nicotine dependence, cigarettes, uncomplicated: Secondary | ICD-10-CM | POA: Insufficient documentation

## 2020-03-28 MED ORDER — PENICILLIN V POTASSIUM 500 MG PO TABS: 500.0000 mg | ORAL_TABLET | Freq: Four times a day (QID) | ORAL | 0 refills | Status: AC

## 2020-03-28 MED ORDER — PREDNISONE 10 MG (21) PO TBPK: ORAL_TABLET | ORAL | 0 refills | Status: DC

## 2020-03-28 MED ORDER — LIDOCAINE VISCOUS HCL 2 % MT SOLN: 15.0000 mL | OROMUCOSAL | 2 refills | Status: AC | PRN

## 2020-03-28 NOTE — Discharge Instructions (Addendum)
Take it easy, but do not lay around too much as this may make any stiffness worse.  Antiinflammatory medications: Take 600 mg of ibuprofen every 6 hours or 440 mg (over the counter dose) to 500 mg (prescription dose) of naproxen every 12 hours for the next 3 days. After this time, these medications may be used as needed for pain. Take these medications with food to avoid upset stomach. Choose only one of these medications, do not take them together. Acetaminophen (generic for Tylenol): Should you continue to have additional pain while taking the ibuprofen or naproxen, you may add in acetaminophen as needed. Your daily total maximum amount of acetaminophen from all sources should be limited to 4000mg /day for persons without liver problems, or 2000mg /day for those with liver problems. Prednisone: Take the prednisone, as prescribed, until finished. If you are a diabetic, please know prednisone can raise your blood sugar temporarily. Follow up: For the arm pain, recommend follow-up with the orthopedic specialist.  Call to make an appointment. Return: Return to the ED should symptoms worsen.  For prescription assistance, may try using prescription discount sites or apps, such as goodrx.com   Dental Pain You have been seen today for dental pain. You should follow up with a dentist as soon as possible. This problem will not resolve on its own without the care of a dentist.  Lidocaine liquid: Use the viscous lidocaine for mouth pain. Swish with the lidocaine and spit it out. Do not swallow it. Salt water solution: You should also swish with a homemade salt water solution, twice a day.  Make this solution by mixing 8 ounces of warm water with about half a teaspoon of salt. Antiinflammatory medications: Take 600 mg of ibuprofen every 6 hours or 440 mg (over the counter dose) to 500 mg (prescription dose) of naproxen every 12 hours for the next 3 days. After this time, these medications may be used as needed  for pain. Take these medications with food to avoid upset stomach. Choose only one of these medications, do not take them together. Acetaminophen (generic for Tylenol): Should you continue to have additional pain while taking the ibuprofen or naproxen, you may add in acetaminophen as needed. Your daily total maximum amount of acetaminophen from all sources should be limited to 4000mg /day for persons without liver problems, or 2000mg /day for those with liver problems.  Please take all of your antibiotics until finished!   You may develop abdominal discomfort or diarrhea from the antibiotic.  You may help offset this with probiotics which you can buy or get in yogurt. Do not eat or take the probiotics until 2 hours after your antibiotic.   For prescription assistance, may try using prescription discount sites or apps, such as goodrx.com  [address], [place] [address] [phone]  [facility] [place] [address] [place], [phone] [facility] [phone]  [facility] [address] [place], [address] [phone]  [facility] [address] [place], [phone] [facility] [phone]  [facility] [address] [place], [phone] [facility] [phone]  [facility] [employer] [email or web address]  [address] [address] [place], [employer] [phone]  Website for free, low-income, or sliding scale dental services in [place]: www.freedentalcare.[place]  To find a dentist in [place] and surrounding areas: [phone]  [facility] [place]  [place] Medicaid  Dentist [email or web address]

## 2020-03-28 NOTE — ED Provider Notes (Signed)
[facility]   CSN: [id number] Arrival date & time: 03/28/20  1330     History Chief Complaint  Patient presents with  . Dental Pain  . Arm Pain    Jerry Scott is a 44 y.o. male.  HPI      Jerry Scott is a 44 y.o. male, with a history of GERD, HIV, HTN, presenting to the ED with two complaints. First, patient complains of left upper dental pain for the past month.  He states he feels as though it has been worsening. He had a previous dental extraction performed on a right upper tooth through a dentist at [facility].  He has an appointment with them for extraction for his current painful tooth next week, however, he was wondering if we would be able to get him an appointment with someone sooner. Denies fever, neck pain, sore throat, throat swelling, difficulty swallowing, shortness of breath.   Patient also complains of left arm tingling and discomfort for the past 3 weeks.  Patient states he woke up with the pain.  Pain seems to radiate from the left elbow into the hand and all the fingers.  Denies numbness, weakness, neck pain, shoulder pain, trauma, other extremity symptoms.   HIV quant undetected on 01/12/20.     Past Medical History:  Diagnosis Date  . Dysrhythmia    had echo done over 1 year ago at [facility] hosp/ "normal"  . GERD (gastroesophageal reflux disease)   . HIV (human immunodeficiency virus infection) (HCC)   . Hypertension    sees Dr. Levada Dy [phone]  . Shortness of breath    due to "smoking"    Patient Active Problem List   Diagnosis Date Noted  . Herniated nucleus pulposus, lumbar 09/04/2011    Class: Acute  . Spinal stenosis of lumbar region with neurogenic claudication 09/04/2011    Class: Diagnosis of    Past Surgical History:  Procedure Laterality Date  . HEMORRHOID SURGERY    . LUMBAR LAMINECTOMY  09/04/2011   Procedure: MICRODISCECTOMY LUMBAR LAMINECTOMY;  Surgeon: Kerrin Champagne, MD;  Location: [facility] OR;  Service: Orthopedics;  Laterality: N/A;  Left L5-S1 Microdiscectomy with MIS Equipment       No family history on file.  Social History   Tobacco Use  . Smoking status: Current Every Day Smoker    Years: 15.00    Types: Cigarettes  . Smokeless tobacco: Never Used  Substance Use Topics  . Alcohol use: Yes    Comment: occassional  . Drug use: Yes    Types: Marijuana    Home Medications Prior to Admission medications   Medication Sig Start Date End Date Taking? Authorizing Provider  atenolol (TENORMIN) 25 MG tablet Take 25 mg by mouth daily. 12/26/18   [provider]  DOVATO 50-300 MG TABS Take 1 tablet by mouth daily. 01/17/19   [provider]  hydrochlorothiazide (HYDRODIURIL) 25 MG tablet Take 25 mg by mouth daily.      [provider]  ibuprofen (ADVIL) 200 MG tablet Take 800 mg by mouth every 6 (six) hours as needed for moderate pain.    [provider]  lidocaine (XYLOCAINE) 2 % solution Use as directed 15 mLs in the mouth or throat as needed for mouth pain. 03/28/20   Peighton Mehra C, PA-C  ondansetron (ZOFRAN ODT) 4 MG disintegrating tablet Take 1 tablet (4 mg total) by mouth every 8 (eight) hours as needed. 03/02/19  Roxy Horseman, PA-C  penicillin v potassium (VEETID) 500 MG tablet Take 1 tablet (500 mg total) by mouth 4 (four) times daily for 7 days. 03/28/20 04/04/20  Justin Buechner C, PA-C  predniSONE (STERAPRED UNI-PAK 21 TAB) 10 MG (21) TBPK tablet Take 6 tabs (60mg ) day 1, 5 tabs (50mg ) day 2, 4 tabs (40mg ) day 3, 3 tabs (30mg ) day 4, 2 tabs (20mg ) day 5, and 1 tab (10mg ) day 6. 03/28/20   Kolby Myung C, PA-C  TRIUMEQ 600-50-300 MG tablet Take 1 tablet by mouth daily. 12/26/18   [provider]    Allergies    Tramadol  Review of Systems   Review of Systems  Constitutional: Negative for chills, diaphoresis and fever.  HENT: Positive for dental problem. Negative for drooling, facial swelling, sore  throat, trouble swallowing and voice change.   Eyes: Negative for visual disturbance.  Respiratory: Negative for shortness of breath.   Gastrointestinal: Negative for nausea and vomiting.  Musculoskeletal: Positive for arthralgias. Negative for back pain and neck pain.  Neurological: Negative for dizziness, weakness, numbness and headaches.  All other systems reviewed and are negative.   Physical Exam Updated Vital Signs BP (!) 138/105 (BP Location: Right Arm)   Pulse 88   Temp 98.3 F (36.8 C) (Oral)   Resp 17   SpO2 98%   Physical Exam Vitals and nursing note reviewed.  Constitutional:      General: He is not in acute distress.    Appearance: He is well-developed. He is not diaphoretic.  HENT:     Head: Normocephalic and atraumatic.     Mouth/Throat:     Mouth: Mucous membranes are moist.     Comments: Poor dentition throughout the mouth. Erosion to at least 3 of the left maxillary teeth in the premolar/molar region.  No noted pulp exposure.  No noted area of intraoral swelling or fluctuance.  No trismus or noted abnormal phonation.  Mouth opening to at least 3 finger widths.  Handles oral secretions without difficulty.  No noted facial swelling.  No sublingual swelling or tongue elevation.  No swelling or tenderness to the submental or submandibular regions.  No swelling or tenderness into the soft tissues of the neck. Eyes:     Extraocular Movements: Extraocular movements intact.     Conjunctiva/sclera: Conjunctivae normal.     Pupils: Pupils are equal, round, and reactive to light.  Cardiovascular:     Rate and Rhythm: Normal rate and regular rhythm.     Pulses: Normal pulses.          Radial pulses are 2+ on the right side and 2+ on the left side.     Comments: Tactile temperature in the extremities appropriate and equal bilaterally. Pulmonary:     Effort: Pulmonary effort is normal. No respiratory distress.  Abdominal:     Tenderness: There is no guarding.   Musculoskeletal:     Cervical back: Neck supple.     Comments: Normal motor function intact in all extremities. No midline spinal tenderness.   No tenderness, swelling, color abnormality throughout the left upper extremity. No changes in patient's symptoms with range of motion of the neck. Full range of motion in the left shoulder, elbow, and wrist.  Lymphadenopathy:     Cervical: No cervical adenopathy.  Skin:    General: Skin is warm and dry.     Capillary Refill: Capillary refill takes less than 2 seconds.  Neurological:     Mental Status: He is  alert.     Comments: Sensation grossly intact to light touch through each of the nerve distributions of the bilateral upper extremities. Abduction and adduction of the fingers intact against resistance. Endorses more pain in the left arm with testing grip strengths, which initially made the left grip seem weaker, however, when the patient was encouraged to demonstrate good strength despite the pain, he was able to do so. Supination and pronation intact against resistance. Strength 5/5 through the cardinal directions of the bilateral wrists. Strength 5/5 with flexion and extension of the bilateral elbows. Patient can touch the thumb to each one of the fingertips without difficulty.  Patient can hold the "OK" sign against resistance.  Psychiatric:        Mood and Affect: Mood and affect normal.        Speech: Speech normal.        Behavior: Behavior normal.     ED Results / Procedures / Treatments   Labs (all labs ordered are listed, but only abnormal results are displayed) Labs Reviewed - No data to display  EKG None  Radiology No results found.  Procedures Procedures (including critical care time)  Medications Ordered in ED Medications - No data to display  ED Course  I have reviewed the triage vital signs and the nursing notes.  Pertinent labs & imaging results that were available during my care of the patient were  reviewed by me and considered in my medical decision making (see chart for details).    MDM Rules/Calculators/A&P                          Patient presents with dental pain.  He has dental follow-up already scheduled.  He was given alternative dental follow-up that he may use if he so desires. Low suspicion for sepsis or Ludwig's angina.  For patient's arm pain and tingling, he does not have any focal deficits.  He will need to follow-up with orthopedics on this matter.  We will try short course of prednisone.  The patient was given instructions for home care as well as return precautions. Patient voices understanding of these instructions, accepts the plan, and is comfortable with discharge.   Vitals:   03/28/20 1340 03/28/20 1504  BP: (!) 138/105 (!) 138/88  Pulse: 88 82  Resp: 17 17  Temp: 98.3 F (36.8 C)   TempSrc: Oral   SpO2: 98% 99%     Final Clinical Impression(s) / ED Diagnoses Final diagnoses:  Left arm pain  Pain, dental    Rx / DC Orders ED Discharge Orders         Ordered    predniSONE (STERAPRED UNI-PAK 21 TAB) 10 MG (21) TBPK tablet     Discontinue  Reprint     03/28/20 1448    penicillin v potassium (VEETID) 500 MG tablet  4 times daily     Discontinue  Reprint     03/28/20 1448    lidocaine (XYLOCAINE) 2 % solution  As needed     Discontinue  Reprint     03/28/20 1448           Anselm Pancoast, PA-C 03/28/20 1601    Tegeler, Canary Brim, MD 03/28/20 [phone]

## 2020-03-28 NOTE — ED Triage Notes (Signed)
Pt complains of left side mouth pain, in cracked tooth, for the past few months, became worse recently. He also complains of left arm pain and weakness x 3 weeks. Pt denies injury. He noticed arm pain when he woke up moved his arm. Arm is painful to move and he cannot lift d/t pain.

## 2020-06-21 IMAGING — CR CHEST - 2 VIEW
2 series · 2 of 2 positions shown · non-contrast
Comparison: 09/04/2011

CLINICAL DATA: Shortness of breath

EXAM:
CHEST - 2 VIEW

[w chest lat]
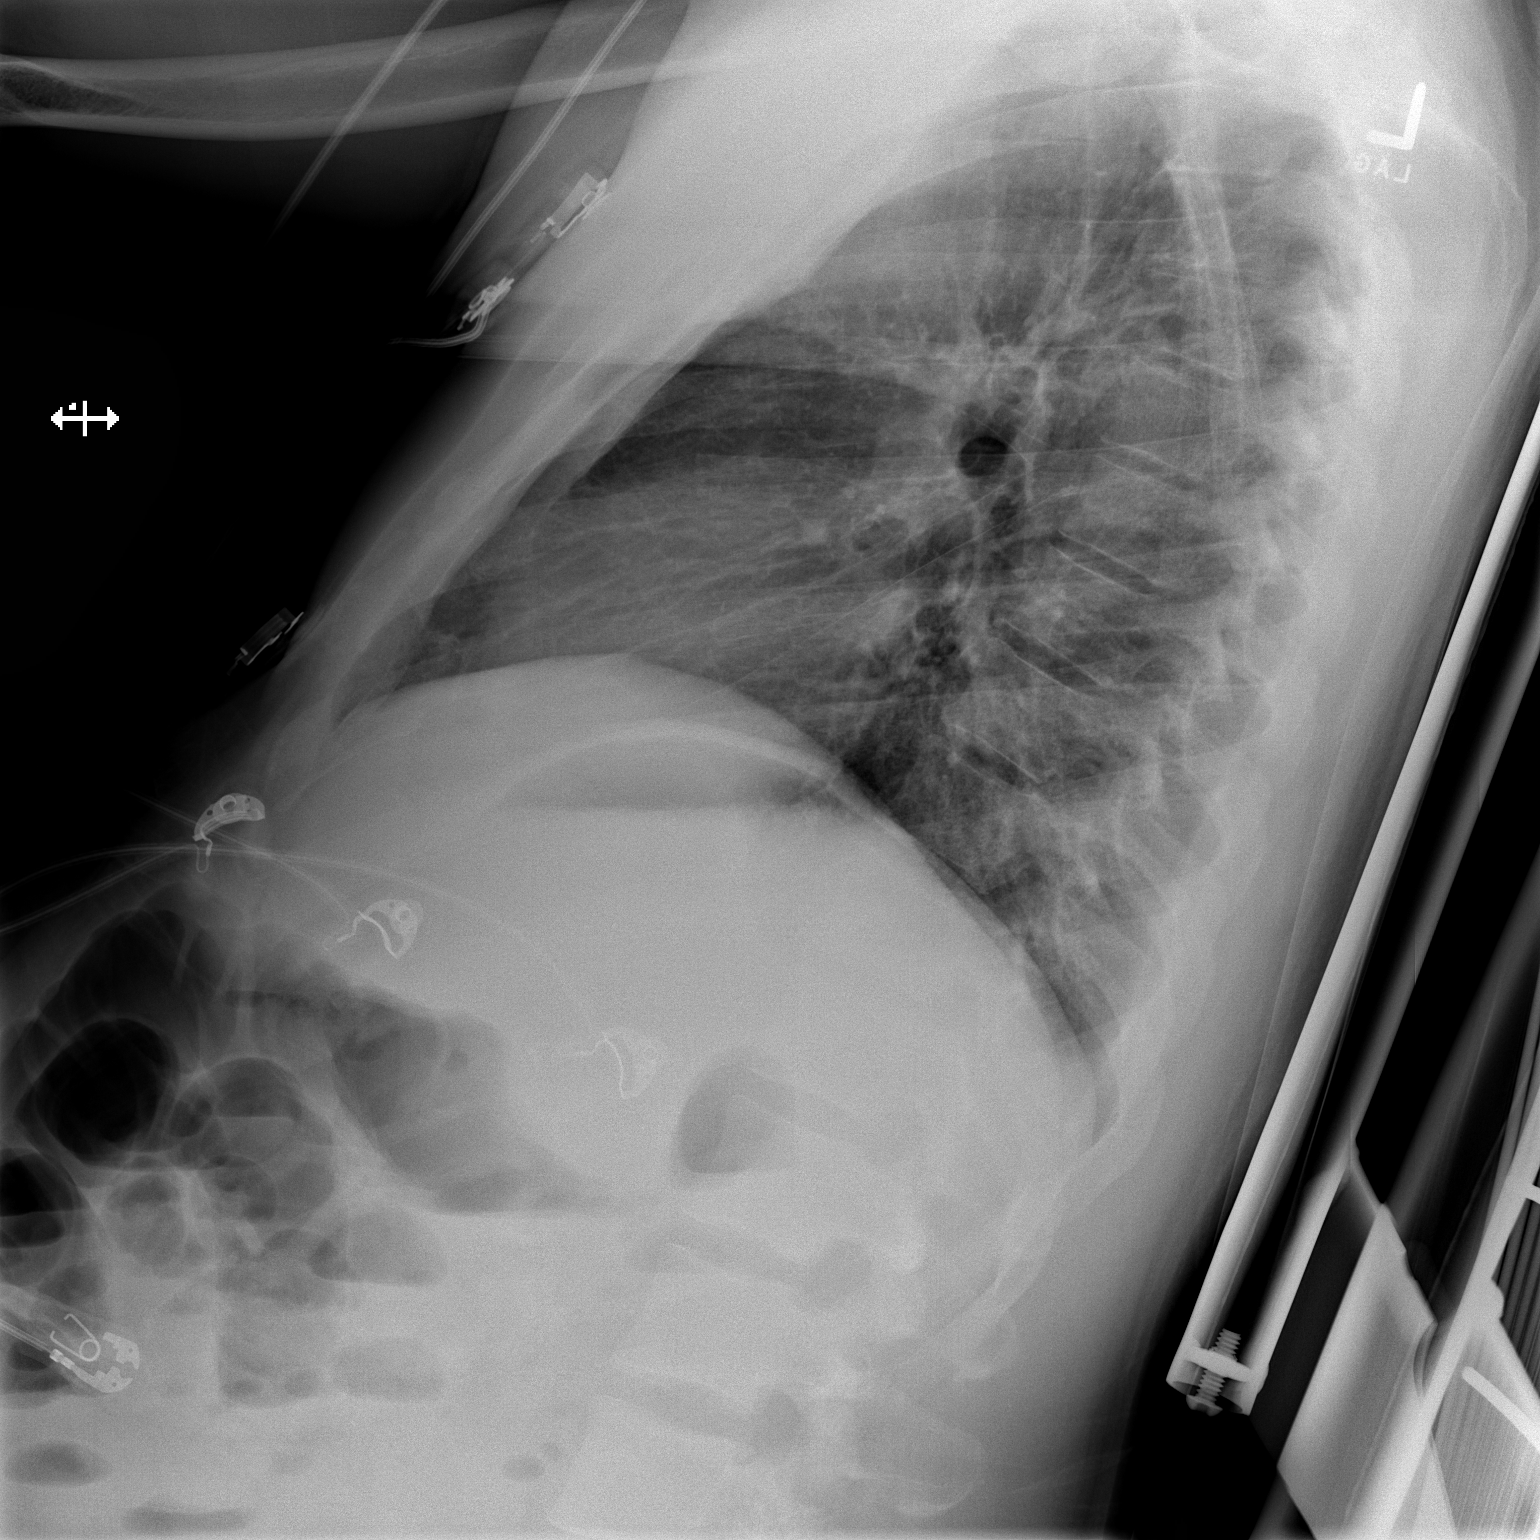

[x chest ap]
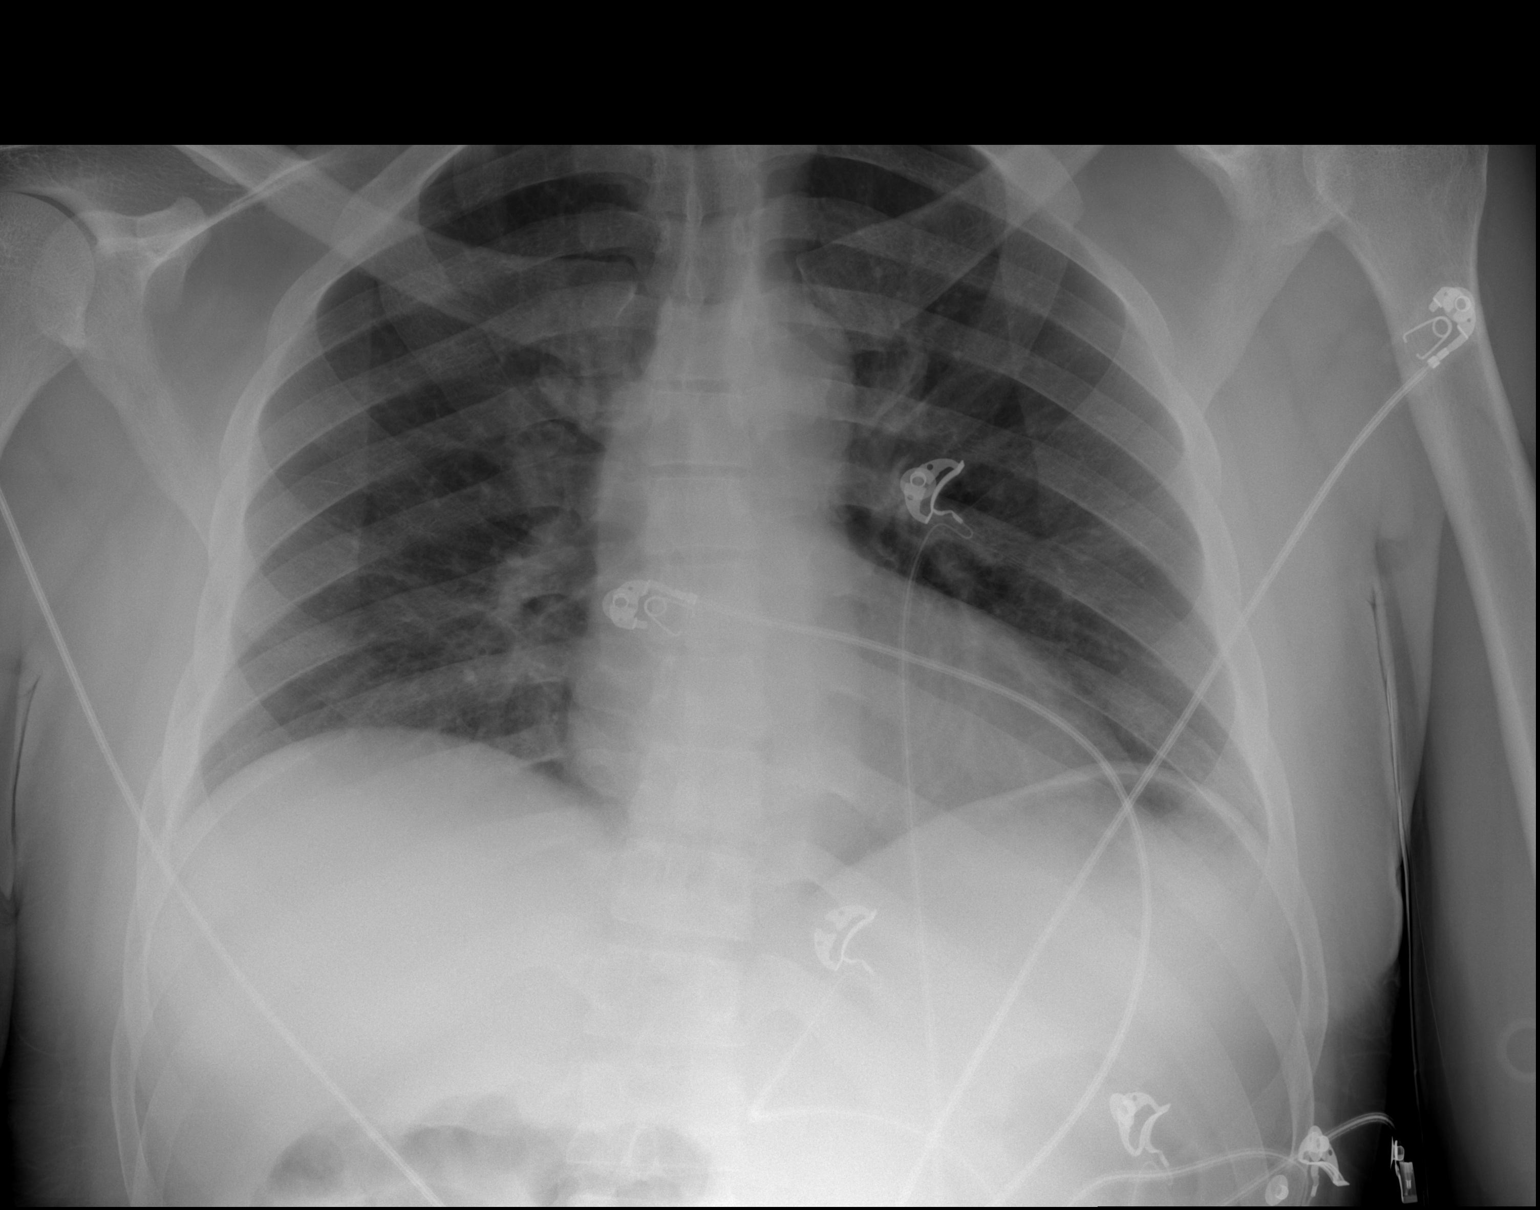

[2 of 2 positions shown; findings below may reference images not displayed]

FINDINGS: The heart size and mediastinal contours are within normal limits.
Both lungs are clear. The visualized skeletal structures are
unremarkable.
IMPRESSION: No active cardiopulmonary disease.

## 2020-06-26 ENCOUNTER — Ambulatory Visit (HOSPITAL_COMMUNITY)
Admission: EM | Admit: 2020-06-26 | Discharge: 2020-06-26 | Disposition: A | Discharge status: Home / Self Care | Payer: PRIVATE HEALTH INSURANCE | Attending: Family Medicine | Admitting: Family Medicine

## 2020-06-26 ENCOUNTER — Encounter (HOSPITAL_COMMUNITY): Payer: Self-pay

## 2020-06-26 ENCOUNTER — Other Ambulatory Visit: Payer: Self-pay

## 2020-06-26 DIAGNOSIS — R350 Frequency of micturition: Secondary | ICD-10-CM | POA: Diagnosis not present

## 2020-06-26 DIAGNOSIS — Z113 Encounter for screening for infections with a predominantly sexual mode of transmission: Secondary | ICD-10-CM | POA: Insufficient documentation

## 2020-06-26 DIAGNOSIS — Z21 Asymptomatic human immunodeficiency virus [HIV] infection status: Secondary | ICD-10-CM | POA: Insufficient documentation

## 2020-06-26 DIAGNOSIS — F17210 Nicotine dependence, cigarettes, uncomplicated: Secondary | ICD-10-CM | POA: Diagnosis not present

## 2020-06-26 DIAGNOSIS — R5383 Other fatigue: Secondary | ICD-10-CM | POA: Diagnosis present

## 2020-06-26 DIAGNOSIS — Z7252 High risk homosexual behavior: Secondary | ICD-10-CM | POA: Insufficient documentation

## 2020-06-26 DIAGNOSIS — Z20822 Contact with and (suspected) exposure to covid-19: Secondary | ICD-10-CM | POA: Diagnosis not present

## 2020-06-26 LAB — POCT URINALYSIS DIPSTICK, ED / UC
Bilirubin Urine: NEGATIVE
Glucose, UA: NEGATIVE mg/dL
Leukocytes,Ua: NEGATIVE
Nitrite: NEGATIVE
Protein, ur: 30 mg/dL — AB
Specific Gravity, Urine: >=1.03 (ref 1.005–1.030)
Urobilinogen, UA: 1 mg/dL (ref 0.0–1.0)
pH: 5.5 (ref 5.0–8.0)

## 2020-06-26 LAB — CBC WITH DIFFERENTIAL/PLATELET
Abs Immature Granulocytes: 0.02 10*3/uL (ref 0.00–0.07)
Basophils Absolute: 0 10*3/uL (ref 0.0–0.1)
Basophils Relative: 1 %
Eosinophils Absolute: 0.2 10*3/uL (ref 0.0–0.5)
Eosinophils Relative: 4 %
HCT: 46.2 % (ref 39.0–52.0)
Hemoglobin: 15.6 g/dL (ref 13.0–17.0)
Immature Granulocytes: 0 %
Lymphocytes Relative: 41 %
Lymphs Abs: 2 10*3/uL (ref 0.7–4.0)
MCH: 30.8 pg (ref 26.0–34.0)
MCHC: 33.8 g/dL (ref 30.0–36.0)
MCV: 91.3 fL (ref 80.0–100.0)
Monocytes Absolute: 0.5 10*3/uL (ref 0.1–1.0)
Monocytes Relative: 11 %
Neutro Abs: 2.1 10*3/uL (ref 1.7–7.7)
Neutrophils Relative %: 43 %
Platelets: 261 10*3/uL (ref 150–400)
RBC: 5.06 MIL/uL (ref 4.22–5.81)
RDW: 12.2 % (ref 11.5–15.5)
WBC: 4.9 10*3/uL (ref 4.0–10.5)
nRBC: 0 % (ref 0.0–0.2)

## 2020-06-26 LAB — COMPREHENSIVE METABOLIC PANEL
ALT: 30 U/L (ref 0–44)
AST: 30 U/L (ref 15–41)
Albumin: 4.1 g/dL (ref 3.5–5.0)
Alkaline Phosphatase: 77 U/L (ref 38–126)
Anion gap: 11 (ref 5–15)
BUN: 9 mg/dL (ref 6–20)
CO2: 26 mmol/L (ref 22–32)
Calcium: 9.7 mg/dL (ref 8.9–10.3)
Chloride: 103 mmol/L (ref 98–111)
Creatinine, Ser: 1.12 mg/dL (ref 0.61–1.24)
GFR, Estimated: >60 mL/min (ref >60)
Glucose, Bld: 88 mg/dL (ref 70–99)
Potassium: 3.6 mmol/L (ref 3.5–5.1)
Sodium: 140 mmol/L (ref 135–145)
Total Bilirubin: 0.4 mg/dL (ref 0.3–1.2)
Total Protein: 8.2 g/dL — ABNORMAL HIGH (ref 6.5–8.1)

## 2020-06-26 MED ORDER — CEFTRIAXONE SODIUM 500 MG IJ SOLR
INTRAMUSCULAR | Status: AC
  Filled 2020-06-26: qty 500

## 2020-06-26 MED ORDER — DOXYCYCLINE HYCLATE 100 MG PO CAPS: 100.0000 mg | ORAL_CAPSULE | Freq: Two times a day (BID) | ORAL | 0 refills | Status: DC

## 2020-06-26 MED ORDER — STERILE WATER FOR INJECTION IJ SOLN
INTRAMUSCULAR | Status: AC
  Filled 2020-06-26: qty 10

## 2020-06-26 MED ORDER — CEFTRIAXONE SODIUM 500 MG IJ SOLR
500.0000 mg | Freq: Once | INTRAMUSCULAR | Status: AC
  Administered 2020-06-26: 500 mg via INTRAMUSCULAR

## 2020-06-26 NOTE — Discharge Instructions (Addendum)
You have been given the following today for treatment of suspected gonorrhea and/or chlamydia:  cefTRIAXone (ROCEPHIN) injection 500 mg  Please pick up your prescription for doxycycline 100 mg and begin taking twice daily for the next seven (7) days.  Even though we have treated you today, we have sent testing for sexually transmitted infections. We will notify you of any positive results once they are received. If required, we will prescribe any medications you might need.  Please refrain from all sexual activity for at least the next seven days.   You have been tested for COVID-19 today. If your test returns positive, you will receive a phone call from [facility] regarding your results. Negative test results are not called. Both positive and negative results area always visible on MyChart. If you do not have a MyChart account, sign up instructions are provided in your discharge papers. Please do not hesitate to contact us should you have questions or concerns.  We will also contact you regarding any significant abnormalities.

## 2020-06-26 NOTE — ED Triage Notes (Addendum)
Patient in with c/o fatigue, penile irritation and dysuria and frequecy that started about 1 week ago. Says sxs started after receiving pneumonia and flu vaccine. Also c/o chills and sweating.  Also says he takes potassium pills that are not dissolving. States he sees them "whole In his stool"  Denies fever, n/v, diarrhea, sob, cough, congestion or other uri sxs  Has had covid vaccines  Requesting STD testing as well

## 2020-06-27 LAB — CYTOLOGY, (ORAL, ANAL, URETHRAL) ANCILLARY ONLY
Chlamydia: NEGATIVE
Comment: NEGATIVE
Comment: NEGATIVE
Comment: NORMAL
Neisseria Gonorrhea: NEGATIVE
Trichomonas: NEGATIVE

## 2020-06-27 LAB — SARS CORONAVIRUS 2 (TAT 6-24 HRS): SARS Coronavirus 2: NEGATIVE

## 2020-06-27 NOTE — ED Provider Notes (Signed)
[facility]   [id number] 06/26/20 Arrival Time: 1549  ASSESSMENT & PLAN:  1. Fatigue, unspecified type   2. Screening for STDs (sexually transmitted diseases)   3. High risk homosexual behavior     Unclear of exact etiology of his symptoms. Question viral illness with mild initial GI symptoms that are improved. COVID testing sent. Benign abdominal exam. No indications for urgent abdominal/pelvic imaging at this time. Discussed. CBC/CMP pending.   Discharge Instructions     You have been given the following today for treatment of suspected gonorrhea and/or chlamydia:  cefTRIAXone (ROCEPHIN) injection 500 mg  Please pick up your prescription for doxycycline 100 mg and begin taking twice daily for the next seven (7) days.  Even though we have treated you today, we have sent testing for sexually transmitted infections. We will notify you of any positive results once they are received. If required, we will prescribe any medications you might need.  Please refrain from all sexual activity for at least the next seven days.   You have been tested for COVID-19 today. If your test returns positive, you will receive a phone call from [facility] regarding your results. Negative test results are not called. Both positive and negative results area always visible on MyChart. If you do not have a MyChart account, sign up instructions are provided in your discharge papers. Please do not hesitate to contact us should you have questions or concerns.  We will also contact you regarding any significant abnormalities.     Follow-up Information    [facility] at [facility].   Specialty: Urgent Care Why: If symptoms worsen in any way. Contact information: [address] [place] [address] [phone]               Reviewed expectations re: course of current medical issues. Questions answered. Outlined signs and symptoms indicating need for  more acute intervention. Patient verbalized understanding. After Visit Summary given.   SUBJECTIVE: History from: patient. Jerry Scott is a 44 y.o. male who presents with complaint of fatigue for the past week. Questions mild urinary frequency. Chills a few days ago; no fever reported. "Just feel tired". HIV is controlled per his report. No abdominal or back pain. Loose stools earlier this week; better now; no bleeding. H/O low K; desires labs to check. Normal PO intake without n/v. Ambulatory without difficulty.  Also requests STD screening. No specific symptoms. Multiple male sexual partners.   Past Surgical History:  Procedure Laterality Date  . HEMORRHOID SURGERY    . LUMBAR LAMINECTOMY  09/04/2011   Procedure: MICRODISCECTOMY LUMBAR LAMINECTOMY;  Surgeon: Kerrin Champagne, MD;  Location: [facility] OR;  Service: Orthopedics;  Laterality: N/A;  Left L5-S1 Microdiscectomy with MIS Equipment     OBJECTIVE:  Vitals:   06/26/20 1642  BP: (!) 152/95  Pulse: 88  Resp: 16  Temp: 97.7 F (36.5 C)  TempSrc: Oral  SpO2: 99%    General appearance: alert, oriented, no acute distress HEENT: Micro; AT; oropharynx moist Lungs: unlabored respirations Abdomen: soft; without distention; no specific tenderness to palpation;without masses or organomegaly; without guarding or rebound tenderness Back: without reported CVA tenderness; FROM at waist Extremities: without LE edema; symmetrical; without gross deformities Skin: warm and dry Neurologic: normal gait Psychological: alert and cooperative; normal mood and affect  Labs: Results for orders placed or performed during the hospital encounter of 06/26/20  SARS CORONAVIRUS 2 (TAT 6-24 HRS) Nasopharyngeal Nasopharyngeal Swab   Specimen:  Nasopharyngeal Swab  Result Value Ref Range   SARS Coronavirus 2 NEGATIVE NEGATIVE  CBC with Differential/Platelet  Result Value Ref Range   WBC 4.9 4.0 - 10.5 K/uL   RBC 5.06 4.22 - 5.81 MIL/uL   Hemoglobin 15.6  13.0 - 17.0 g/dL   HCT 78.2 39 - 52 %   MCV 91.3 80.0 - 100.0 fL   MCH 30.8 26.0 - 34.0 pg   MCHC 33.8 30.0 - 36.0 g/dL   RDW 95.6 21.3 - 08.6 %   Platelets 261 150 - 400 K/uL   nRBC 0.0 0.0 - 0.2 %   Neutrophils Relative % 43 %   Neutro Abs 2.1 1.7 - 7.7 K/uL   Lymphocytes Relative 41 %   Lymphs Abs 2.0 0.7 - 4.0 K/uL   Monocytes Relative 11 %   Monocytes Absolute 0.5 0.1 - 1.0 K/uL   Eosinophils Relative 4 %   Eosinophils Absolute 0.2 0.0 - 0.5 K/uL   Basophils Relative 1 %   Basophils Absolute 0.0 0.0 - 0.1 K/uL   Immature Granulocytes 0 %   Abs Immature Granulocytes 0.02 0.00 - 0.07 K/uL  Comprehensive metabolic panel  Result Value Ref Range   Sodium 140 135 - 145 mmol/L   Potassium 3.6 3.5 - 5.1 mmol/L   Chloride 103 98 - 111 mmol/L   CO2 26 22 - 32 mmol/L   Glucose, Bld 88 70 - 99 mg/dL   BUN 9 6 - 20 mg/dL   Creatinine, Ser 5.78 0.61 - 1.24 mg/dL   Calcium 9.7 8.9 - 46.9 mg/dL   Total Protein 8.2 (H) 6.5 - 8.1 g/dL   Albumin 4.1 3.5 - 5.0 g/dL   AST 30 15 - 41 U/L   ALT 30 0 - 44 U/L   Alkaline Phosphatase 77 38 - 126 U/L   Total Bilirubin 0.4 0.3 - 1.2 mg/dL   GFR, Estimated >62 >95 mL/min   Anion gap 11 5 - 15  POC Urinalysis dipstick  Result Value Ref Range   Glucose, UA NEGATIVE NEGATIVE mg/dL   Bilirubin Urine NEGATIVE NEGATIVE   Ketones, ur TRACE (A) NEGATIVE mg/dL   Specific Gravity, Urine >=1.030 1.005 - 1.030   Hgb urine dipstick TRACE (A) NEGATIVE   pH 5.5 5.0 - 8.0   Protein, ur 30 (A) NEGATIVE mg/dL   Urobilinogen, UA 1.0 0.0 - 1.0 mg/dL   Nitrite NEGATIVE NEGATIVE   Leukocytes,Ua NEGATIVE NEGATIVE     Allergies  Allergen Reactions  . Tramadol Other (See Comments)    Sweat and shivers                                               Past Medical History:  Diagnosis Date  . Dysrhythmia    had echo done over 1 year ago at [facility] hosp/ "normal"  . GERD (gastroesophageal reflux disease)   . HIV (human immunodeficiency virus infection) (HCC)    . Hypertension    sees Dr. Levada Dy [phone]  . Shortness of breath    due to "smoking"    Social History   Socioeconomic History  . Marital status: Divorced    Spouse name: Not on file  . Number of children: Not on file  . Years of education: Not on file  . Highest education level: Not on file  Occupational History  . Not on file  Tobacco Use  . Smoking status: Current Every Day Smoker    Years: 15.00    Types: Cigarettes  . Smokeless tobacco: Never Used  Vaping Use  . Vaping Use: Every day  Substance and Sexual Activity  . Alcohol use: Not Currently    Comment: occassional  . Drug use: Yes    Types: Marijuana  . Sexual activity: Yes  Other Topics Concern  . Not on file  Social History Narrative  . Not on file   Social Determinants of Health   Financial Resource Strain:   . Difficulty of Paying Living Expenses: Not on file  Food Insecurity:   . Worried About Programme researcher, broadcasting/film/video in the Last Year: Not on file  . Ran Out of Food in the Last Year: Not on file  Transportation Needs:   . Lack of Transportation (Medical): Not on file  . Lack of Transportation (Non-Medical): Not on file  Physical Activity:   . Days of Exercise per Week: Not on file  . Minutes of Exercise per Session: Not on file  Stress:   . Feeling of Stress : Not on file  Social Connections:   . Frequency of Communication with Friends and Family: Not on file  . Frequency of Social Gatherings with Friends and Family: Not on file  . Attends Religious Services: Not on file  . Active Member of Clubs or Organizations: Not on file  . Attends Banker Meetings: Not on file  . Marital Status: Not on file  Intimate Partner Violence:   . Fear of Current or Ex-Partner: Not on file  . Emotionally Abused: Not on file  . Physically Abused: Not on file  . Sexually Abused: Not on file    Family History  Problem Relation Age of Onset  . Seizures Mother   . Healthy Father      Mardella Layman, MD 06/27/20 [phone]

## 2020-10-15 ENCOUNTER — Ambulatory Visit (HOSPITAL_COMMUNITY)
Admission: EM | Admit: 2020-10-15 | Discharge: 2020-10-15 | Disposition: A | Discharge status: Home / Self Care | Payer: BC Managed Care – PPO | Attending: Student | Admitting: Student

## 2020-10-15 ENCOUNTER — Encounter (HOSPITAL_COMMUNITY): Payer: Self-pay | Admitting: Emergency Medicine

## 2020-10-15 ENCOUNTER — Other Ambulatory Visit: Payer: Self-pay

## 2020-10-15 DIAGNOSIS — J069 Acute upper respiratory infection, unspecified: Secondary | ICD-10-CM | POA: Diagnosis present

## 2020-10-15 DIAGNOSIS — I10 Essential (primary) hypertension: Secondary | ICD-10-CM

## 2020-10-15 DIAGNOSIS — Z1152 Encounter for screening for COVID-19: Secondary | ICD-10-CM

## 2020-10-15 DIAGNOSIS — F17200 Nicotine dependence, unspecified, uncomplicated: Secondary | ICD-10-CM | POA: Diagnosis present

## 2020-10-15 NOTE — Discharge Instructions (Addendum)
-  Continue over-the-counter medications for control of your viral cold symptoms. -Continue taking your blood pressure medications as directed -Seek additional medical attention if you develop shortness of breath, chest pain, worse headache of life, abdominal pain, worsening of symptoms despite treatment, etc. -We sent a test for COVID-19 today. This should come back sometime tomorrow.

## 2020-10-15 NOTE — ED Provider Notes (Signed)
[facility]    CSN: [id number] Arrival date & time: 10/15/20  1758      History   Chief Complaint Chief Complaint  Patient presents with  . Cough    HPI Jerry Scott is a 45 y.o. male presenting with congestion, cough, body aches, weakness. History hypertension, HIV, GERD, cigarette smoking with chronic shortness of breath. He states his current shortness of breath is related to nasal congestion and having to wear a mask a work. Denies fevers/chills, n/v/d, shortness of breath, chest pain, facial pain, teeth pain, headaches, sore throat, loss of taste/smell, swollen lymph nodes, ear pain.    HPI  Past Medical History:  Diagnosis Date  . Dysrhythmia    had echo done over 1 year ago at [facility] hosp/ "normal"  . GERD (gastroesophageal reflux disease)   . HIV (human immunodeficiency virus infection) (HCC)   . Hypertension    sees Dr. Levada Dy [phone]  . Shortness of breath    due to "smoking"    Patient Active Problem List   Diagnosis Date Noted  . Herniated nucleus pulposus, lumbar 09/04/2011    Class: Acute  . Spinal stenosis of lumbar region with neurogenic claudication 09/04/2011    Class: Diagnosis of    Past Surgical History:  Procedure Laterality Date  . HEMORRHOID SURGERY    . LUMBAR LAMINECTOMY  09/04/2011   Procedure: MICRODISCECTOMY LUMBAR LAMINECTOMY;  Surgeon: Kerrin Champagne, MD;  Location: [facility] OR;  Service: Orthopedics;  Laterality: N/A;  Left L5-S1 Microdiscectomy with MIS Equipment       Home Medications    Prior to Admission medications   Medication Sig Start Date End Date Taking? Authorizing Provider  atenolol (TENORMIN) 25 MG tablet Take 25 mg by mouth daily. 12/26/18  Yes [provider]  hydrochlorothiazide (HYDRODIURIL) 25 MG tablet Take 25 mg by mouth daily.   Yes [provider]  DOVATO 50-300 MG TABS Take 1 tablet by mouth daily. 01/17/19   [provider]  doxycycline (VIBRAMYCIN) 100 MG capsule  Take 1 capsule (100 mg total) by mouth 2 (two) times daily. 06/26/20   Mardella Layman, MD  ibuprofen (ADVIL) 200 MG tablet Take 800 mg by mouth every 6 (six) hours as needed for moderate pain.    [provider]  lidocaine (XYLOCAINE) 2 % solution Use as directed 15 mLs in the mouth or throat as needed for mouth pain. 03/28/20   Joy, Shawn C, PA-C  ondansetron (ZOFRAN ODT) 4 MG disintegrating tablet Take 1 tablet (4 mg total) by mouth every 8 (eight) hours as needed. 03/02/19   Roxy Horseman, PA-C  Oxycodone HCl 10 MG TABS Take 10 mg by mouth 4 (four) times daily. 09/20/20   [provider]  potassium chloride (KLOR-CON) 10 MEQ tablet TAKE 2 TABLETS(20 MEQ) BY MOUTH DAILY 04/23/20   [provider]  predniSONE (STERAPRED UNI-PAK 21 TAB) 10 MG (21) TBPK tablet Take 6 tabs (60mg ) day 1, 5 tabs (50mg ) day 2, 4 tabs (40mg ) day 3, 3 tabs (30mg ) day 4, 2 tabs (20mg ) day 5, and 1 tab (10mg ) day 6. 03/28/20   Joy, Shawn C, PA-C  TRIUMEQ 600-50-300 MG tablet Take 1 tablet by mouth daily. 12/26/18   [provider]    Family History Family History  Problem Relation Age of Onset  . Seizures Mother   . Healthy Father     Social History Social History   Tobacco Use  . Smoking status: Current Every Day Smoker  Years: 15.00    Types: Cigarettes  . Smokeless tobacco: Never Used  Vaping Use  . Vaping Use: Every day  Substance Use Topics  . Alcohol use: Not Currently    Comment: occassional  . Drug use: Yes    Types: Marijuana     Allergies   Tramadol   Review of Systems Review of Systems  Constitutional: Negative for appetite change, chills and fever.  HENT: Positive for congestion. Negative for ear pain, rhinorrhea, sinus pressure, sinus pain and sore throat.   Eyes: Negative for redness and visual disturbance.  Respiratory: Positive for cough. Negative for chest tightness, shortness of breath and wheezing.   Cardiovascular: Negative for chest pain and  palpitations.  Gastrointestinal: Negative for abdominal pain, constipation, diarrhea, nausea and vomiting.  Genitourinary: Negative for dysuria, frequency and urgency.  Musculoskeletal: Positive for myalgias.  Neurological: Negative for dizziness, weakness and headaches.  Psychiatric/Behavioral: Negative for confusion.  All other systems reviewed and are negative.    Physical Exam Triage Vital Signs ED Triage Vitals  Enc Vitals Group     BP 10/15/20 1835 (!) 147/101     Pulse Rate 10/15/20 1835 61     Resp 10/15/20 1835 18     Temp 10/15/20 1835 97.9 F (36.6 C)     Temp Source 10/15/20 1835 Oral     SpO2 10/15/20 1835 96 %     Weight --      Height --      Head Circumference --      Peak Flow --      Pain Score 10/15/20 1831 7     Pain Loc --      Pain Edu? --      Excl. in GC? --    No data found.  Updated Vital Signs BP (!) 147/101 (BP Location: Right Arm) Comment: took bp meds right before getting here today  Pulse 61   Temp 97.9 F (36.6 C) (Oral)   Resp 18   SpO2 96%   Visual Acuity Right Eye Distance:   Left Eye Distance:   Bilateral Distance:    Right Eye Near:   Left Eye Near:    Bilateral Near:     Physical Exam Vitals reviewed.  Constitutional:      General: He is not in acute distress.    Appearance: Normal appearance. He is not ill-appearing.  HENT:     Head: Normocephalic and atraumatic.     Right Ear: Hearing, tympanic membrane, ear canal and external ear normal. No swelling or tenderness. There is no impacted cerumen. No mastoid tenderness. Tympanic membrane is not perforated, erythematous, retracted or bulging.     Left Ear: Hearing, tympanic membrane, ear canal and external ear normal. No swelling or tenderness. There is no impacted cerumen. No mastoid tenderness. Tympanic membrane is not perforated, erythematous, retracted or bulging.     Nose:     Right Sinus: No maxillary sinus tenderness or frontal sinus tenderness.     Left Sinus: No  maxillary sinus tenderness or frontal sinus tenderness.     Mouth/Throat:     Mouth: Mucous membranes are moist.     Pharynx: Uvula midline. No oropharyngeal exudate or posterior oropharyngeal erythema.     Tonsils: No tonsillar exudate.  Cardiovascular:     Rate and Rhythm: Normal rate and regular rhythm.     Heart sounds: Normal heart sounds.  Pulmonary:     Breath sounds: Normal breath sounds and air entry. No wheezing,  rhonchi or rales.  Chest:     Chest wall: No tenderness.  Abdominal:     General: Abdomen is flat. Bowel sounds are normal.     Tenderness: There is no abdominal tenderness. There is no guarding or rebound.  Lymphadenopathy:     Cervical: No cervical adenopathy.  Neurological:     General: No focal deficit present.     Mental Status: He is alert and oriented to person, place, and time.  Psychiatric:        Attention and Perception: Attention and perception normal.        Mood and Affect: Mood and affect normal.        Behavior: Behavior normal. Behavior is cooperative.        Thought Content: Thought content normal.        Judgment: Judgment normal.      UC Treatments / Results  Labs (all labs ordered are listed, but only abnormal results are displayed) Labs Reviewed  SARS CORONAVIRUS 2 (TAT 6-24 HRS)    EKG   Radiology No results found.  Procedures Procedures (including critical care time)  Medications Ordered in UC Medications - No data to display  Initial Impression / Assessment and Plan / UC Course  I have reviewed the triage vital signs and the nursing notes.  Pertinent labs & imaging results that were available during my care of the patient were reviewed by me and considered in my medical decision making (see chart for details).     This patient is a 45 year old male presenting with viral URI symptoms. Covid test sent today. Isolation precautions as per CDC guidelines.  He is content with control of symptoms on OTC medications;  continue these.   He states he only just took his BP medications, and he smoked a cigarette immediately before this appointment. Continue meds as directed.   Return precautions- chest pain, shortness of breath, new/worsening fevers/chills, confusion, worsening of symptoms despite the above treatment plan, etc.   Spent over 40 minutes obtaining H&P, performing physical, discussing results, treatment plan and plan for follow-up with patient. Patient agrees with plan.   This chart was dictated using voice recognition software, Dragon. Despite the best efforts of this provider to proofread and correct errors, errors may still occur which can change documentation meaning.     Final Clinical Impressions(s) / UC Diagnoses   Final diagnoses:  Viral URI with cough  Encounter for screening for COVID-19  Essential hypertension  Current smoker     Discharge Instructions     -Continue over-the-counter medications for control of your viral cold symptoms. -Continue taking your blood pressure medications as directed -Seek additional medical attention if you develop shortness of breath, chest pain, worse headache of life, abdominal pain, worsening of symptoms despite treatment, etc. -We sent a test for COVID-19 today. This should come back sometime tomorrow.    ED Prescriptions    None     PDMP not reviewed this encounter.   Rhys Martini, PA-C 10/15/20 1853

## 2020-10-15 NOTE — ED Triage Notes (Signed)
patient started feeling bad 3 days ago.  Patient has stuffy nose, sob, body aching, and weakness

## 2020-10-16 LAB — SARS CORONAVIRUS 2 (TAT 6-24 HRS): SARS Coronavirus 2: NEGATIVE

## 2020-10-20 ENCOUNTER — Other Ambulatory Visit: Payer: Self-pay

## 2020-10-20 ENCOUNTER — Ambulatory Visit (HOSPITAL_COMMUNITY)
Admission: EM | Admit: 2020-10-20 | Discharge: 2020-10-20 | Disposition: A | Discharge status: Home / Self Care | Payer: BC Managed Care – PPO | Attending: Urgent Care | Admitting: Urgent Care

## 2020-10-20 ENCOUNTER — Encounter (HOSPITAL_COMMUNITY): Payer: Self-pay

## 2020-10-20 DIAGNOSIS — M25561 Pain in right knee: Secondary | ICD-10-CM

## 2020-10-20 DIAGNOSIS — S86911A Strain of unspecified muscle(s) and tendon(s) at lower leg level, right leg, initial encounter: Secondary | ICD-10-CM

## 2020-10-20 MED ORDER — PREDNISONE 10 MG PO TABS: 30.0000 mg | ORAL_TABLET | Freq: Every day | ORAL | 0 refills | Status: AC

## 2020-10-20 NOTE — ED Provider Notes (Signed)
[facility]   MRN: [id number] DOB: [birth year]  Subjective:   Jerry Scott is a 45 y.o. male presenting for 1 day history of acute onset right knee pain.  Patient typically has it in the left knee but this time had a in the right knee.  Admits that it is related to his work.  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  Has not used medications for relief.  States that he has been trying to walk through the pain, unfortunately yesterday he really got aggravated when he was walking running errands.  Patient is HIV positive and takes Dovato, Triumeq.  No current facility-administered medications for this encounter.  Current Outpatient Medications:  .  atenolol (TENORMIN) 25 MG tablet, Take 25 mg by mouth daily., Disp: , Rfl:  .  DOVATO 50-300 MG TABS, Take 1 tablet by mouth daily., Disp: , Rfl:  .  doxycycline (VIBRAMYCIN) 100 MG capsule, Take 1 capsule (100 mg total) by mouth 2 (two) times daily., Disp: 14 capsule, Rfl: 0 .  hydrochlorothiazide (HYDRODIURIL) 25 MG tablet, Take 25 mg by mouth daily., Disp: , Rfl:  .  ibuprofen (ADVIL) 200 MG tablet, Take 800 mg by mouth every 6 (six) hours as needed for moderate pain., Disp: , Rfl:  .  lidocaine (XYLOCAINE) 2 % solution, Use as directed 15 mLs in the mouth or throat as needed for mouth pain., Disp: 100 mL, Rfl: 2 .  ondansetron (ZOFRAN ODT) 4 MG disintegrating tablet, Take 1 tablet (4 mg total) by mouth every 8 (eight) hours as needed., Disp: 10 tablet, Rfl: 0 .  Oxycodone HCl 10 MG TABS, Take 10 mg by mouth 4 (four) times daily., Disp: , Rfl:  .  potassium chloride (KLOR-CON) 10 MEQ tablet, TAKE 2 TABLETS(20 MEQ) BY MOUTH DAILY, Disp: , Rfl:  .  predniSONE (STERAPRED UNI-PAK 21 TAB) 10 MG (21) TBPK tablet, Take 6 tabs (60mg ) day 1, 5 tabs (50mg ) day 2, 4 tabs (40mg ) day 3, 3 tabs (30mg ) day 4, 2 tabs  (20mg ) day 5, and 1 tab (10mg ) day 6., Disp: 21 tablet, Rfl: 0 .  TRIUMEQ 600-50-300 MG tablet, Take 1 tablet by mouth daily., Disp: , Rfl:    Allergies  Allergen Reactions  . Tramadol Other (See Comments)    Sweat and shivers    Past Medical History:  Diagnosis Date  . Dysrhythmia    had echo done over 1 year ago at [facility] hosp/ "normal"  . GERD (gastroesophageal reflux disease)   . HIV (human immunodeficiency virus infection) (HCC)   . Hypertension    sees Dr. [phone]  . Shortness of breath    due to "smoking"     Past Surgical History:  Procedure Laterality Date  . HEMORRHOID SURGERY    . LUMBAR LAMINECTOMY  09/04/2011   Procedure: MICRODISCECTOMY LUMBAR LAMINECTOMY;  Surgeon: , MD;  Location: [facility] OR;  Service: Orthopedics;  Laterality: N/A;  Left L5-S1 Microdiscectomy with MIS Equipment    Family History  Problem Relation Age of Onset  . Seizures Mother   . Healthy Father     Social History   Tobacco Use  . Smoking status: Current Every Day Smoker    Years: 15.00    Types: Cigarettes  . Smokeless tobacco: Never Used  Vaping Use  .  Vaping Use: Every day  Substance Use Topics  . Alcohol use: Not Currently    Comment: occassional  . Drug use: Yes    Types: Marijuana    ROS   Objective:   Vitals: BP (!) 143/95   Pulse 83   Temp 97.8 F (36.6 C)   Resp 18   SpO2 95%   Physical Exam Constitutional:      General: He is not in acute distress.    Appearance: Normal appearance. He is well-developed and normal weight. He is not ill-appearing, toxic-appearing or diaphoretic.  HENT:     Head: Normocephalic and atraumatic.     Right Ear: External ear normal.     Left Ear: External ear normal.     Nose: Nose normal.     Mouth/Throat:     Pharynx: Oropharynx is clear.  Eyes:     General: No scleral icterus.       Right eye: No discharge.        Left eye: No discharge.     Extraocular Movements: Extraocular movements intact.      Pupils: Pupils are equal, round, and reactive to light.  Cardiovascular:     Rate and Rhythm: Normal rate.  Pulmonary:     Effort: Pulmonary effort is normal.  Musculoskeletal:     Cervical back: Normal range of motion.     Right knee: No swelling, deformity, effusion, erythema, ecchymosis, lacerations, bony tenderness or crepitus. Normal range of motion. Tenderness (mild over either side of the patella) present. Normal alignment and normal patellar mobility.     Left knee: No swelling, deformity, effusion, erythema, ecchymosis, lacerations, bony tenderness or crepitus. Normal range of motion. No tenderness. Normal alignment and normal patellar mobility.  Neurological:     Mental Status: He is alert and oriented to person, place, and time.  Psychiatric:        Mood and Affect: Mood normal.        Behavior: Behavior normal.        Thought Content: Thought content normal.        Judgment: Judgment normal.     Assessment and Plan :   PDMP not reviewed this encounter.  1. Acute pain of right knee   2. Strain of right knee, initial encounter     Recommended conservative management with Ace wrap, prednisone.  Will avoid NSAIDs given his treatment for HIV and risk of adverse side effects with use of NSAIDs.  Deferred imaging given physical exam findings and HPI. Counseled patient on potential for adverse effects with medications prescribed/recommended today, ER and return-to-clinic precautions discussed, patient verbalized understanding.    Wallis Bamberg, [place] 10/20/20 1743

## 2020-10-20 NOTE — ED Notes (Signed)
Ace wrap applied by provider

## 2020-10-20 NOTE — ED Triage Notes (Signed)
Pt presents with c/o right knee pain that started yesterday while walking, denies injury, needs work note

## 2020-11-14 ENCOUNTER — Emergency Department (HOSPITAL_BASED_OUTPATIENT_CLINIC_OR_DEPARTMENT_OTHER): Payer: BC Managed Care – PPO

## 2020-11-14 ENCOUNTER — Ambulatory Visit (HOSPITAL_COMMUNITY)
Admission: EM | Admit: 2020-11-14 | Discharge: 2020-11-14 | Disposition: A | Discharge status: Home / Self Care | Payer: BC Managed Care – PPO

## 2020-11-14 ENCOUNTER — Emergency Department (HOSPITAL_BASED_OUTPATIENT_CLINIC_OR_DEPARTMENT_OTHER)
Admission: EM | Admit: 2020-11-14 | Discharge: 2020-11-14 | Disposition: A | Discharge status: Home / Self Care | Payer: BC Managed Care – PPO | Attending: Emergency Medicine | Admitting: Emergency Medicine

## 2020-11-14 ENCOUNTER — Encounter (HOSPITAL_BASED_OUTPATIENT_CLINIC_OR_DEPARTMENT_OTHER): Payer: Self-pay | Admitting: *Deleted

## 2020-11-14 ENCOUNTER — Other Ambulatory Visit: Payer: Self-pay

## 2020-11-14 DIAGNOSIS — Z21 Asymptomatic human immunodeficiency virus [HIV] infection status: Secondary | ICD-10-CM | POA: Diagnosis not present

## 2020-11-14 DIAGNOSIS — R404 Transient alteration of awareness: Secondary | ICD-10-CM | POA: Diagnosis not present

## 2020-11-14 DIAGNOSIS — I10 Essential (primary) hypertension: Secondary | ICD-10-CM | POA: Insufficient documentation

## 2020-11-14 DIAGNOSIS — F17210 Nicotine dependence, cigarettes, uncomplicated: Secondary | ICD-10-CM | POA: Insufficient documentation

## 2020-11-14 LAB — CBC WITH DIFFERENTIAL/PLATELET
Abs Immature Granulocytes: 0.01 10*3/uL (ref 0.00–0.07)
Basophils Absolute: 0 10*3/uL (ref 0.0–0.1)
Basophils Relative: 0 %
Eosinophils Absolute: 0.1 10*3/uL (ref 0.0–0.5)
Eosinophils Relative: 3 %
HCT: 45.6 % (ref 39.0–52.0)
Hemoglobin: 14.5 g/dL (ref 13.0–17.0)
Immature Granulocytes: 0 %
Lymphocytes Relative: 50 %
Lymphs Abs: 2.5 10*3/uL (ref 0.7–4.0)
MCH: 30.5 pg (ref 26.0–34.0)
MCHC: 31.8 g/dL (ref 30.0–36.0)
MCV: 95.8 fL (ref 80.0–100.0)
Monocytes Absolute: 0.4 10*3/uL (ref 0.1–1.0)
Monocytes Relative: 8 %
Neutro Abs: 2 10*3/uL (ref 1.7–7.7)
Neutrophils Relative %: 39 %
Platelets: 251 10*3/uL (ref 150–400)
RBC: 4.76 MIL/uL (ref 4.22–5.81)
RDW: 12.8 % (ref 11.5–15.5)
WBC: 5.1 10*3/uL (ref 4.0–10.5)
nRBC: 0 % (ref 0.0–0.2)

## 2020-11-14 LAB — COMPREHENSIVE METABOLIC PANEL
ALT: 11 U/L (ref 0–44)
AST: 15 U/L (ref 15–41)
Albumin: 4.3 g/dL (ref 3.5–5.0)
Alkaline Phosphatase: 63 U/L (ref 38–126)
Anion gap: 7 (ref 5–15)
BUN: 17 mg/dL (ref 6–20)
CO2: 30 mmol/L (ref 22–32)
Calcium: 9.5 mg/dL (ref 8.9–10.3)
Chloride: 104 mmol/L (ref 98–111)
Creatinine, Ser: 1.07 mg/dL (ref 0.61–1.24)
GFR, Estimated: >60 mL/min (ref >60)
Glucose, Bld: 98 mg/dL (ref 70–99)
Potassium: 3.5 mmol/L (ref 3.5–5.1)
Sodium: 141 mmol/L (ref 135–145)
Total Bilirubin: 0.3 mg/dL (ref 0.3–1.2)
Total Protein: 7.2 g/dL (ref 6.5–8.1)

## 2020-11-14 LAB — RAPID URINE DRUG SCREEN, HOSP PERFORMED
Amphetamines: POSITIVE — AB
Barbiturates: NOT DETECTED
Benzodiazepines: NOT DETECTED
Cocaine: POSITIVE — AB
Opiates: NOT DETECTED
Tetrahydrocannabinol: POSITIVE — AB

## 2020-11-14 NOTE — Discharge Instructions (Signed)
You were seen in the emergency room today with forgetfulness at work.  We discussed your lab and urine findings.  I think these may be contributing to your symptoms.  Your potassium is normal.  Please follow close with your primary care doctor and return to the emergency department any new or suddenly worsening symptoms.

## 2020-11-14 NOTE — ED Provider Notes (Signed)
Emergency Department Provider Note   I have reviewed the triage vital signs and the nursing notes.   HISTORY  Chief Complaint Memory Loss   HPI Jerry Scott is a 45 y.o. male with past medical history reviewed below including HIV presents to the emergency department for evaluation of forgetfulness at work only.  Patient states that 4 days ago he was at work and made mistakes on several orders.  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  He denies any amnesia regarding the events.  He did not have vision changes or headaches.  No numbness or weakness.  He states that he was actually sent home due to the issue.  He states that symptoms began after he started taking his potassium pills again.  He states that he is taken potassium in the past and had similar issue.  He took the pills on Sunday and Monday and then stopped when he developed the forgetfulness.  He was advised by his job to seek medical evaluation.  No new symptoms in the past several days although he has not returned to work as of yet.  He denies any drug or alcohol use.  Denies any increased stress.  He states he does not sleep as much as he should but that is not particularly new.    Past Medical History:  Diagnosis Date  . Dysrhythmia    had echo done over 1 year ago at [facility] hosp/ "normal"  . GERD (gastroesophageal reflux disease)   . HIV (human immunodeficiency virus infection) (HCC)   . Hypertension    sees Dr. Levada Dy [phone]  . Shortness of breath    due to "smoking"    Patient Active Problem List   Diagnosis Date Noted  . Herniated nucleus pulposus, lumbar 09/04/2011    Class: Acute  . Spinal stenosis of lumbar region with neurogenic claudication 09/04/2011    Class: Diagnosis of    Past Surgical History:  Procedure Laterality Date  . HEMORRHOID SURGERY    .  LUMBAR LAMINECTOMY  09/04/2011   Procedure: MICRODISCECTOMY LUMBAR LAMINECTOMY;  Surgeon: Kerrin Champagne, MD;  Location: [facility] OR;  Service: Orthopedics;  Laterality: N/A;  Left L5-S1 Microdiscectomy with MIS Equipment    Allergies Tramadol  Family History  Problem Relation Age of Onset  . Seizures Mother   . Healthy Father     Social History Social History   Tobacco Use  . Smoking status: Current Every Day Smoker    Years: 15.00    Types: Cigarettes  . Smokeless tobacco: Never Used  Vaping Use  . Vaping Use: Every day  Substance Use Topics  . Alcohol use: Not Currently    Comment: occassional  . Drug use: Yes    Types: Marijuana    Review of Systems  Constitutional: No fever/chills Eyes: No visual changes. ENT: No sore throat. Cardiovascular: Denies chest pain. Respiratory: Denies shortness of breath. Gastrointestinal: No abdominal pain.  No nausea, no vomiting.  No diarrhea.  No constipation. Genitourinary: Negative for dysuria. Musculoskeletal: Negative for back pain. Skin: Negative for rash. Neurological: Negative for headaches, focal weakness or numbness. Positive forgetfulness.   10-point ROS otherwise negative.  ____________________________________________   PHYSICAL EXAM:  VITAL SIGNS: ED Triage Vitals [11/14/20 2022]  Enc Vitals Group     BP 131/79     Pulse Rate 90  Resp 18     Temp 99.1 F (37.3 C)     Temp Source Oral     SpO2 96 %     Weight 210 lb (95.3 kg)     Height 5\' 10"  (1.778 m)   Constitutional: Alert and oriented x 4. Well appearing and in no acute distress. Eyes: Conjunctivae are normal. PERRL.  Head: Atraumatic. Nose: No congestion/rhinnorhea. Mouth/Throat: Mucous membranes are moist.  Neck: No stridor.   Cardiovascular: Normal rate, regular rhythm. Good peripheral circulation. Grossly normal heart sounds.   Respiratory: Normal respiratory effort.  No retractions. Lungs CTAB. Gastrointestinal: Soft and nontender. No  distention.  Musculoskeletal: No lower extremity tenderness nor edema. No gross deformities of extremities. Neurologic:  Normal speech and language. No gross focal neurologic deficits are appreciated.  No cranial nerve deficits appreciated.  Equal strength and sensation in the bilateral upper and lower extremities.  Skin:  Skin is warm, dry and intact. No rash noted.   ____________________________________________   LABS (all labs ordered are listed, but only abnormal results are displayed)  Labs Reviewed  RAPID URINE DRUG SCREEN, HOSP PERFORMED - Abnormal; Notable for the following components:      Result Value   Cocaine POSITIVE (*)    Amphetamines POSITIVE (*)    Tetrahydrocannabinol POSITIVE (*)    All other components within normal limits  COMPREHENSIVE METABOLIC PANEL  CBC WITH DIFFERENTIAL/PLATELET   ____________________________________________  RADIOLOGY  CT Head Wo Contrast  Result Date: 11/14/2020 CLINICAL DATA:  Altered mental status. EXAM: CT HEAD WITHOUT CONTRAST TECHNIQUE: Contiguous axial images were obtained from the base of the skull through the vertex without intravenous contrast. COMPARISON:  None. FINDINGS: Brain: No evidence of acute infarction, hemorrhage, hydrocephalus, extra-axial collection or mass lesion/mass effect. Vascular: No hyperdense vessels are identified. Skull: Normal. Negative for fracture or focal lesion. Sinuses/Orbits: No acute finding. Other: None. IMPRESSION: No acute intracranial pathology. Electronically Signed   By: 11/16/2020 M.D.   On: 11/14/2020 21:33    ____________________________________________   PROCEDURES  Procedure(s) performed:   Procedures  None  ____________________________________________   INITIAL IMPRESSION / ASSESSMENT AND PLAN / ED COURSE  Pertinent labs & imaging results that were available during my care of the patient were reviewed by me and considered in my medical decision making (see chart for  details).   Patient presents to the emergency department with uncharacteristic forgetfulness at work.  He attributes the symptoms to restarting his potassium pills.  Plan for screening blood work to assess his potassium level as well as CBC.  Will obtain a urine drug screen as well as CT scan of the head.  Symptoms are not consistent with TGA.  He has a normal neurologic exam and my suspicion for TIA is very low.  Partial seizure is a consideration although these episodes only seem to happen while at work.  He is not forgetful in other ways at home and does not have periods of time that he cannot account for.   09:55 PM  Lab work including potassium were normal.  CT scan of the head performed no acute findings.  Patient's UDS is positive for cocaine, THC, amphetamines.  We discussed these results and that I suspect they may be contributing to his symptoms at work.  Will not refer on to neurology but have advised that if symptoms continue once he abstains from illicit substances he should follow with his primary care doctor for further evaluation and referral at that time if indicated. ____________________________________________  FINAL CLINICAL IMPRESSION(S) / ED DIAGNOSES  Final diagnoses:  Transient alteration of awareness    Note:  This document was prepared using Dragon voice recognition software and may include unintentional dictation errors.  Alona Bene, MD, [facility]    Jayziah Bankhead, Arlyss Repress, MD 11/14/20 2201

## 2020-11-14 NOTE — ED Triage Notes (Signed)
Pt reports that on Sunday, he had episodes of forgetfulness at his job. Reports that he went to [facility] yesterday and left AMA due to long wait times. States that he thinks the episodes were related to his potassium pills. Reports that he is 'back to normal today'.

## 2021-05-27 ENCOUNTER — Encounter (HOSPITAL_COMMUNITY): Payer: Self-pay

## 2021-05-27 ENCOUNTER — Emergency Department (HOSPITAL_COMMUNITY)
Admission: EM | Admit: 2021-05-27 | Discharge: 2021-05-28 | Disposition: A | Discharge status: Home / Self Care | Payer: BC Managed Care – PPO | Attending: Student | Admitting: Student

## 2021-05-27 DIAGNOSIS — R55 Syncope and collapse: Secondary | ICD-10-CM | POA: Insufficient documentation

## 2021-05-27 DIAGNOSIS — Z5321 Procedure and treatment not carried out due to patient leaving prior to being seen by health care provider: Secondary | ICD-10-CM | POA: Insufficient documentation

## 2021-05-27 LAB — COMPREHENSIVE METABOLIC PANEL
ALT: 20 U/L (ref 0–44)
AST: 28 U/L (ref 15–41)
Albumin: 4.2 g/dL (ref 3.5–5.0)
Alkaline Phosphatase: 65 U/L (ref 38–126)
Anion gap: 10 (ref 5–15)
BUN: 13 mg/dL (ref 6–20)
CO2: 22 mmol/L (ref 22–32)
Calcium: 8.9 mg/dL (ref 8.9–10.3)
Chloride: 104 mmol/L (ref 98–111)
Creatinine, Ser: 1.09 mg/dL (ref 0.61–1.24)
GFR, Estimated: >60 mL/min (ref >60)
Glucose, Bld: 115 mg/dL — ABNORMAL HIGH (ref 70–99)
Potassium: 3.4 mmol/L — ABNORMAL LOW (ref 3.5–5.1)
Sodium: 136 mmol/L (ref 135–145)
Total Bilirubin: 0.6 mg/dL (ref 0.3–1.2)
Total Protein: 7.7 g/dL (ref 6.5–8.1)

## 2021-05-27 LAB — CBC WITH DIFFERENTIAL/PLATELET
Abs Immature Granulocytes: 0 10*3/uL (ref 0.00–0.07)
Basophils Absolute: 0 10*3/uL (ref 0.0–0.1)
Basophils Relative: 1 %
Eosinophils Absolute: 0.1 10*3/uL (ref 0.0–0.5)
Eosinophils Relative: 3 %
HCT: 47.1 % (ref 39.0–52.0)
Hemoglobin: 14.5 g/dL (ref 13.0–17.0)
Immature Granulocytes: 0 %
Lymphocytes Relative: 46 %
Lymphs Abs: 1.6 10*3/uL (ref 0.7–4.0)
MCH: 30.9 pg (ref 26.0–34.0)
MCHC: 30.8 g/dL (ref 30.0–36.0)
MCV: 100.2 fL — ABNORMAL HIGH (ref 80.0–100.0)
Monocytes Absolute: 0.4 10*3/uL (ref 0.1–1.0)
Monocytes Relative: 12 %
Neutro Abs: 1.3 10*3/uL — ABNORMAL LOW (ref 1.7–7.7)
Neutrophils Relative %: 38 %
Platelets: 200 10*3/uL (ref 150–400)
RBC: 4.7 MIL/uL (ref 4.22–5.81)
RDW: 12.9 % (ref 11.5–15.5)
WBC: 3.5 10*3/uL — ABNORMAL LOW (ref 4.0–10.5)
nRBC: 0 % (ref 0.0–0.2)

## 2021-05-27 NOTE — ED Triage Notes (Signed)
Pt reports syncope episode on 3am Monday morning. Pt feel and hit his right upper eye/head on the carpet.   Pt reports throbbing sensation on left temporal area.    A/Ox4 Ambulatory in triage   Denies chest pain or shob.

## 2021-05-27 NOTE — ED Provider Notes (Signed)
Emergency Medicine Provider Triage Evaluation Note  Jerry Scott , a 45 y.o. male  was evaluated in triage.  Pt complains of syncope.  On Sunday the patient passed out, hit the right side of his face.  After that he started noticing a pulsating sensation on the left side of his face.  History of 2 similar events years ago, none since.  When he awoke he started making a gurgling sound.  Patient denies any headaches, nausea, vomiting, vision changes, chest pain, shortness of breath.  No recent changes in medication..  Review of Systems  Positive: Syncope Negative: Headache  Physical Exam  There were no vitals taken for this visit. Gen:   Awake, no distress   Resp:  Normal effort  MSK:   Moves extremities without difficulty  Other:  No dysarthria, PERRLA  Medical Decision Making  Medically screening exam initiated at 10:58 AM.  Appropriate orders placed.  Susy Manor was informed that the remainder of the evaluation will be completed by another provider, this initial triage assessment does not replace that evaluation, and the importance of remaining in the ED until their evaluation is complete.  Syncope work-up   Theron Arista, Cordelia Poche 05/27/21 1227    Terrilee Files, MD 05/27/21 [phone]

## 2021-06-02 ENCOUNTER — Encounter (HOSPITAL_COMMUNITY): Payer: Self-pay

## 2021-06-02 ENCOUNTER — Emergency Department (HOSPITAL_COMMUNITY)
Admission: EM | Admit: 2021-06-02 | Discharge: 2021-06-02 | Disposition: A | Discharge status: Home / Self Care | Payer: Medicaid Other | Attending: Emergency Medicine | Admitting: Emergency Medicine

## 2021-06-02 DIAGNOSIS — R55 Syncope and collapse: Secondary | ICD-10-CM | POA: Insufficient documentation

## 2021-06-02 DIAGNOSIS — R519 Headache, unspecified: Secondary | ICD-10-CM | POA: Insufficient documentation

## 2021-06-02 DIAGNOSIS — Z5321 Procedure and treatment not carried out due to patient leaving prior to being seen by health care provider: Secondary | ICD-10-CM | POA: Insufficient documentation

## 2021-06-02 NOTE — ED Triage Notes (Signed)
Pt states cont head pain x1 week after syncopal episode. States he was seen, blood and EKG looked good. Left before any further testing. Pt requesting CT.

## 2021-06-03 ENCOUNTER — Other Ambulatory Visit: Payer: Self-pay

## 2021-06-03 ENCOUNTER — Encounter (HOSPITAL_COMMUNITY): Payer: Self-pay | Admitting: Emergency Medicine

## 2021-06-03 ENCOUNTER — Emergency Department (HOSPITAL_COMMUNITY): Payer: Self-pay

## 2021-06-03 ENCOUNTER — Emergency Department (HOSPITAL_COMMUNITY)
Admission: EM | Admit: 2021-06-03 | Discharge: 2021-06-04 | Disposition: A | Discharge status: Home / Self Care | Payer: Self-pay | Attending: Emergency Medicine | Admitting: Emergency Medicine

## 2021-06-03 DIAGNOSIS — R110 Nausea: Secondary | ICD-10-CM | POA: Insufficient documentation

## 2021-06-03 DIAGNOSIS — R531 Weakness: Secondary | ICD-10-CM | POA: Insufficient documentation

## 2021-06-03 DIAGNOSIS — Z5321 Procedure and treatment not carried out due to patient leaving prior to being seen by health care provider: Secondary | ICD-10-CM | POA: Insufficient documentation

## 2021-06-03 DIAGNOSIS — R519 Headache, unspecified: Secondary | ICD-10-CM | POA: Insufficient documentation

## 2021-06-03 DIAGNOSIS — R5383 Other fatigue: Secondary | ICD-10-CM | POA: Insufficient documentation

## 2021-06-03 NOTE — ED Triage Notes (Signed)
Pt endorses passing out 2 weeks ago and fell on right side of face. Endorses left sided head pain, weak, lethargic and nauseous.

## 2021-06-03 NOTE — ED Notes (Signed)
Called pt 3x no response °

## 2021-06-03 NOTE — ED Notes (Signed)
Called pt, no response. 

## 2021-06-03 NOTE — ED Notes (Signed)
Pt named called in waiting room x2, na answer, named pulled back out to [facility].

## 2021-06-03 NOTE — ED Provider Notes (Signed)
Emergency Medicine Provider Triage Evaluation Note  Jerry Scott , a 45 y.o. male  was evaluated in triage.  Pt complains of left-sided facial pain.  1 week ago patient fainted and fell onto the right side of his face.  Now he is with pain to the touch of his left face.  Review of Systems  Positive: Pain, jaw claudication Negative: Visual changes  Physical Exam  BP (!) 139/91   Pulse 95   Temp 98.6 F (37 C)   Resp 16   Ht 5\' 10"  (1.778 m)   Wt 97.5 kg   SpO2 99%   BMI 30.85 kg/m  Gen:   Awake, no distress   Resp:  Normal effort  MSK:   Moves extremities without difficulty  Other:    Medical Decision Making  Medically screening exam initiated at 3:42 PM.  Appropriate orders placed.  was informed that the remainder of the evaluation will be completed by another provider, this initial triage assessment does not replace that evaluation, and the importance of remaining in the ED until their evaluation is complete.  Tenderness to left occiput and maxillary skull.   Jerry Manor, PA-C 06/03/21 1543    08/03/21, DO 06/05/21 1006

## 2021-10-03 ENCOUNTER — Other Ambulatory Visit: Payer: Self-pay

## 2021-10-03 ENCOUNTER — Ambulatory Visit
Admission: EM | Admit: 2021-10-03 | Discharge: 2021-10-03 | Disposition: A | Discharge status: Home / Self Care | Payer: 59 | Attending: Physician Assistant | Admitting: Physician Assistant

## 2021-10-03 DIAGNOSIS — K6289 Other specified diseases of anus and rectum: Secondary | ICD-10-CM

## 2021-10-03 MED ORDER — HYDROCORTISONE (PERIANAL) 2.5 % EX CREA: 1.0000 "application " | TOPICAL_CREAM | Freq: Two times a day (BID) | CUTANEOUS | 0 refills | Status: AC

## 2021-10-03 NOTE — ED Triage Notes (Signed)
4 day h/o pain around his anus. Pt notes pain with wiping his bottom and having BM. Pt reports he feels like his anus has a fissure and c/o swelling. Denies blood in stools. Notes one bump around his anus. Has been using ointment w/o relief.
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
## 2021-11-16 ENCOUNTER — Emergency Department (HOSPITAL_COMMUNITY): Payer: Commercial Managed Care - HMO

## 2021-11-16 ENCOUNTER — Other Ambulatory Visit: Payer: Self-pay

## 2021-11-16 ENCOUNTER — Encounter (HOSPITAL_COMMUNITY): Payer: Self-pay

## 2021-11-16 ENCOUNTER — Emergency Department (HOSPITAL_COMMUNITY)
Admission: EM | Admit: 2021-11-16 | Discharge: 2021-11-17 | Disposition: A | Discharge status: Home / Self Care | Payer: Commercial Managed Care - HMO | Attending: Emergency Medicine | Admitting: Emergency Medicine

## 2021-11-16 DIAGNOSIS — M79605 Pain in left leg: Secondary | ICD-10-CM

## 2021-11-16 DIAGNOSIS — R202 Paresthesia of skin: Secondary | ICD-10-CM | POA: Diagnosis not present

## 2021-11-16 DIAGNOSIS — Y92410 Unspecified street and highway as the place of occurrence of the external cause: Secondary | ICD-10-CM | POA: Diagnosis not present

## 2021-11-16 DIAGNOSIS — F17210 Nicotine dependence, cigarettes, uncomplicated: Secondary | ICD-10-CM | POA: Diagnosis not present

## 2021-11-16 DIAGNOSIS — Z21 Asymptomatic human immunodeficiency virus [HIV] infection status: Secondary | ICD-10-CM | POA: Insufficient documentation

## 2021-11-16 DIAGNOSIS — S3992XA Unspecified injury of lower back, initial encounter: Secondary | ICD-10-CM | POA: Insufficient documentation

## 2021-11-16 DIAGNOSIS — S8992XA Unspecified injury of left lower leg, initial encounter: Secondary | ICD-10-CM | POA: Diagnosis present

## 2021-11-16 DIAGNOSIS — I10 Essential (primary) hypertension: Secondary | ICD-10-CM | POA: Insufficient documentation

## 2021-11-16 DIAGNOSIS — S169XXA Unspecified injury of muscle, fascia and tendon at neck level, initial encounter: Secondary | ICD-10-CM | POA: Diagnosis not present

## 2021-11-16 MED ORDER — NAPROXEN 500 MG PO TABS
500.0000 mg | ORAL_TABLET | Freq: Once | ORAL | Status: AC
  Administered 2021-11-16: 500 mg via ORAL
  Filled 2021-11-16: qty 1
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
## 2021-11-17 ENCOUNTER — Emergency Department (HOSPITAL_COMMUNITY): Payer: Commercial Managed Care - HMO
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
## 2021-11-20 ENCOUNTER — Ambulatory Visit: Payer: Managed Care, Other (non HMO)

## 2021-11-20 ENCOUNTER — Other Ambulatory Visit: Payer: Self-pay

## 2021-11-20 ENCOUNTER — Ambulatory Visit
Payer: Commercial Managed Care - HMO | Attending: Internal Medicine | Admitting: Rehabilitative and Restorative Service Providers"

## 2021-11-20 ENCOUNTER — Encounter: Payer: Self-pay | Admitting: Rehabilitative and Restorative Service Providers"

## 2021-11-20 DIAGNOSIS — M6281 Muscle weakness (generalized): Secondary | ICD-10-CM | POA: Insufficient documentation

## 2021-11-20 DIAGNOSIS — R262 Difficulty in walking, not elsewhere classified: Secondary | ICD-10-CM | POA: Diagnosis present

## 2021-11-20 DIAGNOSIS — M545 Low back pain, unspecified: Secondary | ICD-10-CM | POA: Insufficient documentation

## 2021-11-20 DIAGNOSIS — R252 Cramp and spasm: Secondary | ICD-10-CM | POA: Diagnosis present

## 2021-11-20 DIAGNOSIS — M79605 Pain in left leg: Secondary | ICD-10-CM | POA: Diagnosis present
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
## 2021-11-20 NOTE — Patient Instructions (Signed)

## 2021-11-25 ENCOUNTER — Ambulatory Visit: Payer: Commercial Managed Care - HMO

## 2021-11-25 ENCOUNTER — Other Ambulatory Visit: Payer: Self-pay

## 2021-11-25 DIAGNOSIS — M6281 Muscle weakness (generalized): Secondary | ICD-10-CM

## 2021-11-25 DIAGNOSIS — M79605 Pain in left leg: Secondary | ICD-10-CM

## 2021-11-25 DIAGNOSIS — R252 Cramp and spasm: Secondary | ICD-10-CM

## 2021-11-25 DIAGNOSIS — M545 Low back pain, unspecified: Secondary | ICD-10-CM

## 2021-11-25 DIAGNOSIS — R262 Difficulty in walking, not elsewhere classified: Secondary | ICD-10-CM
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
## 2021-11-27 ENCOUNTER — Encounter: Payer: Self-pay | Admitting: Rehabilitative and Restorative Service Providers"

## 2021-11-27 ENCOUNTER — Ambulatory Visit: Payer: Commercial Managed Care - HMO | Admitting: Rehabilitative and Restorative Service Providers"

## 2021-11-27 DIAGNOSIS — M6281 Muscle weakness (generalized): Secondary | ICD-10-CM

## 2021-11-27 DIAGNOSIS — M545 Low back pain, unspecified: Secondary | ICD-10-CM

## 2021-11-27 DIAGNOSIS — R252 Cramp and spasm: Secondary | ICD-10-CM

## 2021-11-27 DIAGNOSIS — M79605 Pain in left leg: Secondary | ICD-10-CM

## 2021-11-27 DIAGNOSIS — R262 Difficulty in walking, not elsewhere classified: Secondary | ICD-10-CM
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
## 2021-12-02 ENCOUNTER — Ambulatory Visit: Payer: Commercial Managed Care - HMO | Attending: Internal Medicine

## 2021-12-02 DIAGNOSIS — M6281 Muscle weakness (generalized): Secondary | ICD-10-CM | POA: Diagnosis present

## 2021-12-02 DIAGNOSIS — M79605 Pain in left leg: Secondary | ICD-10-CM | POA: Insufficient documentation

## 2021-12-02 DIAGNOSIS — M545 Low back pain, unspecified: Secondary | ICD-10-CM | POA: Diagnosis present

## 2021-12-02 DIAGNOSIS — R252 Cramp and spasm: Secondary | ICD-10-CM | POA: Diagnosis present

## 2021-12-02 DIAGNOSIS — R262 Difficulty in walking, not elsewhere classified: Secondary | ICD-10-CM | POA: Diagnosis present
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
## 2021-12-04 ENCOUNTER — Ambulatory Visit: Payer: Commercial Managed Care - HMO

## 2021-12-04 DIAGNOSIS — M6281 Muscle weakness (generalized): Secondary | ICD-10-CM | POA: Diagnosis not present

## 2021-12-04 DIAGNOSIS — R262 Difficulty in walking, not elsewhere classified: Secondary | ICD-10-CM

## 2021-12-04 DIAGNOSIS — R252 Cramp and spasm: Secondary | ICD-10-CM

## 2021-12-04 DIAGNOSIS — M79605 Pain in left leg: Secondary | ICD-10-CM

## 2021-12-04 DIAGNOSIS — M545 Low back pain, unspecified: Secondary | ICD-10-CM
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
## 2021-12-09 ENCOUNTER — Ambulatory Visit: Payer: Commercial Managed Care - HMO

## 2021-12-09 DIAGNOSIS — R252 Cramp and spasm: Secondary | ICD-10-CM

## 2021-12-09 DIAGNOSIS — M6281 Muscle weakness (generalized): Secondary | ICD-10-CM

## 2021-12-09 DIAGNOSIS — M545 Low back pain, unspecified: Secondary | ICD-10-CM

## 2021-12-09 DIAGNOSIS — M79605 Pain in left leg: Secondary | ICD-10-CM

## 2021-12-09 DIAGNOSIS — R262 Difficulty in walking, not elsewhere classified: Secondary | ICD-10-CM
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
## 2021-12-10 ENCOUNTER — Encounter: Payer: Self-pay | Admitting: Physical Therapy

## 2021-12-10 ENCOUNTER — Ambulatory Visit: Payer: Commercial Managed Care - HMO | Admitting: Physical Therapy

## 2021-12-10 DIAGNOSIS — R262 Difficulty in walking, not elsewhere classified: Secondary | ICD-10-CM

## 2021-12-10 DIAGNOSIS — M545 Low back pain, unspecified: Secondary | ICD-10-CM

## 2021-12-10 DIAGNOSIS — M6281 Muscle weakness (generalized): Secondary | ICD-10-CM

## 2021-12-10 DIAGNOSIS — R252 Cramp and spasm: Secondary | ICD-10-CM
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
## 2021-12-11 ENCOUNTER — Ambulatory Visit: Payer: Commercial Managed Care - HMO

## 2021-12-16 ENCOUNTER — Encounter: Payer: Self-pay | Admitting: Rehabilitative and Restorative Service Providers"

## 2021-12-16 ENCOUNTER — Ambulatory Visit: Payer: Commercial Managed Care - HMO | Admitting: Rehabilitative and Restorative Service Providers"

## 2021-12-16 DIAGNOSIS — R262 Difficulty in walking, not elsewhere classified: Secondary | ICD-10-CM

## 2021-12-16 DIAGNOSIS — M79605 Pain in left leg: Secondary | ICD-10-CM

## 2021-12-16 DIAGNOSIS — M6281 Muscle weakness (generalized): Secondary | ICD-10-CM | POA: Diagnosis not present

## 2021-12-16 DIAGNOSIS — R252 Cramp and spasm: Secondary | ICD-10-CM

## 2021-12-16 DIAGNOSIS — M545 Low back pain, unspecified: Secondary | ICD-10-CM
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
## 2021-12-18 ENCOUNTER — Encounter: Payer: Self-pay | Admitting: Rehabilitative and Restorative Service Providers"

## 2021-12-18 ENCOUNTER — Ambulatory Visit: Payer: Commercial Managed Care - HMO | Admitting: Rehabilitative and Restorative Service Providers"

## 2021-12-18 DIAGNOSIS — R252 Cramp and spasm: Secondary | ICD-10-CM

## 2021-12-18 DIAGNOSIS — R262 Difficulty in walking, not elsewhere classified: Secondary | ICD-10-CM

## 2021-12-18 DIAGNOSIS — M79605 Pain in left leg: Secondary | ICD-10-CM

## 2021-12-18 DIAGNOSIS — M6281 Muscle weakness (generalized): Secondary | ICD-10-CM

## 2021-12-18 DIAGNOSIS — M545 Low back pain, unspecified: Secondary | ICD-10-CM
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
## 2021-12-23 ENCOUNTER — Ambulatory Visit: Payer: Commercial Managed Care - HMO | Admitting: Rehabilitative and Restorative Service Providers"

## 2021-12-23 ENCOUNTER — Encounter: Payer: Self-pay | Admitting: Rehabilitative and Restorative Service Providers"

## 2021-12-23 DIAGNOSIS — M545 Low back pain, unspecified: Secondary | ICD-10-CM

## 2021-12-23 DIAGNOSIS — M6281 Muscle weakness (generalized): Secondary | ICD-10-CM | POA: Diagnosis not present

## 2021-12-23 DIAGNOSIS — R252 Cramp and spasm: Secondary | ICD-10-CM

## 2021-12-23 DIAGNOSIS — M79605 Pain in left leg: Secondary | ICD-10-CM

## 2021-12-23 DIAGNOSIS — R262 Difficulty in walking, not elsewhere classified: Secondary | ICD-10-CM
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
## 2021-12-25 ENCOUNTER — Ambulatory Visit: Payer: Commercial Managed Care - HMO | Admitting: Rehabilitative and Restorative Service Providers"

## 2021-12-25 ENCOUNTER — Encounter: Payer: Self-pay | Admitting: Rehabilitative and Restorative Service Providers"

## 2021-12-25 DIAGNOSIS — M6281 Muscle weakness (generalized): Secondary | ICD-10-CM

## 2021-12-25 DIAGNOSIS — R252 Cramp and spasm: Secondary | ICD-10-CM

## 2021-12-25 DIAGNOSIS — M79605 Pain in left leg: Secondary | ICD-10-CM

## 2021-12-25 DIAGNOSIS — R262 Difficulty in walking, not elsewhere classified: Secondary | ICD-10-CM

## 2021-12-25 DIAGNOSIS — M545 Low back pain, unspecified: Secondary | ICD-10-CM
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
## 2021-12-30 ENCOUNTER — Encounter: Payer: 59 | Admitting: Rehabilitative and Restorative Service Providers"

## 2022-01-01 ENCOUNTER — Encounter: Payer: 59 | Admitting: Rehabilitative and Restorative Service Providers"

## 2022-08-17 ENCOUNTER — Other Ambulatory Visit: Payer: Self-pay | Admitting: Orthopedic Surgery

## 2022-08-17 DIAGNOSIS — M5116 Intervertebral disc disorders with radiculopathy, lumbar region: Secondary | ICD-10-CM
